# Patient Record
Sex: Female | Born: 1950 | Race: White | Hispanic: No | Marital: Married | Smoking: Former smoker
Health system: Southern US, Community
[De-identification: ages and names within clinical notes are randomized; demographics above are authoritative.]

## PROBLEM LIST (undated history)

## (undated) DIAGNOSIS — D219 Benign neoplasm of connective and other soft tissue, unspecified: Secondary | ICD-10-CM

## (undated) DIAGNOSIS — H3530 Unspecified macular degeneration: Secondary | ICD-10-CM

## (undated) DIAGNOSIS — L409 Psoriasis, unspecified: Secondary | ICD-10-CM

## (undated) DIAGNOSIS — N893 Dysplasia of vagina, unspecified: Secondary | ICD-10-CM

## (undated) DIAGNOSIS — D072 Carcinoma in situ of vagina: Secondary | ICD-10-CM

## (undated) DIAGNOSIS — N870 Mild cervical dysplasia: Secondary | ICD-10-CM

## (undated) HISTORY — DX: Unspecified macular degeneration: H35.30

## (undated) HISTORY — PX: OTHER SURGICAL HISTORY: SHX169

## (undated) HISTORY — DX: Psoriasis, unspecified: L40.9

## (undated) HISTORY — DX: Dysplasia of vagina, unspecified: N89.3

## (undated) HISTORY — DX: Benign neoplasm of connective and other soft tissue, unspecified: D21.9

## (undated) HISTORY — DX: Carcinoma in situ of vagina: D07.2

## (undated) HISTORY — DX: Mild cervical dysplasia: N87.0

---

## 1983-11-19 HISTORY — PX: TUBAL LIGATION: SHX77

## 1997-11-18 HISTORY — PX: ABDOMINAL HYSTERECTOMY: SHX81

## 1998-04-11 ENCOUNTER — Other Ambulatory Visit: Admission: RE | Admit: 1998-04-11 | Discharge: 1998-04-11 | Payer: Self-pay | Admitting: Obstetrics and Gynecology

## 1998-06-06 ENCOUNTER — Other Ambulatory Visit: Admission: RE | Admit: 1998-06-06 | Discharge: 1998-06-06 | Payer: Self-pay | Admitting: Obstetrics and Gynecology

## 1998-07-27 ENCOUNTER — Other Ambulatory Visit: Admission: RE | Admit: 1998-07-27 | Discharge: 1998-07-27 | Payer: Self-pay | Admitting: Obstetrics and Gynecology

## 1998-08-28 ENCOUNTER — Inpatient Hospital Stay (HOSPITAL_COMMUNITY): Admission: RE | Admit: 1998-08-28 | Discharge: 1998-08-30 | Payer: Self-pay | Admitting: Obstetrics and Gynecology

## 1999-02-05 ENCOUNTER — Ambulatory Visit (HOSPITAL_COMMUNITY): Admission: RE | Admit: 1999-02-05 | Discharge: 1999-02-05 | Payer: Self-pay | Admitting: Gastroenterology

## 1999-03-21 ENCOUNTER — Other Ambulatory Visit: Admission: RE | Admit: 1999-03-21 | Discharge: 1999-03-21 | Payer: Self-pay | Admitting: Radiology

## 1999-04-06 ENCOUNTER — Other Ambulatory Visit: Admission: RE | Admit: 1999-04-06 | Discharge: 1999-04-06 | Payer: Self-pay | Admitting: Obstetrics and Gynecology

## 1999-05-09 ENCOUNTER — Other Ambulatory Visit: Admission: RE | Admit: 1999-05-09 | Discharge: 1999-05-09 | Payer: Self-pay | Admitting: Obstetrics and Gynecology

## 1999-06-28 ENCOUNTER — Encounter (INDEPENDENT_AMBULATORY_CARE_PROVIDER_SITE_OTHER): Payer: Self-pay | Admitting: Specialist

## 1999-06-28 ENCOUNTER — Other Ambulatory Visit: Admission: RE | Admit: 1999-06-28 | Discharge: 1999-06-28 | Payer: Self-pay | Admitting: Obstetrics and Gynecology

## 1999-10-30 ENCOUNTER — Other Ambulatory Visit: Admission: RE | Admit: 1999-10-30 | Discharge: 1999-10-30 | Payer: Self-pay | Admitting: Obstetrics and Gynecology

## 2000-04-07 ENCOUNTER — Other Ambulatory Visit: Admission: RE | Admit: 2000-04-07 | Discharge: 2000-04-07 | Payer: Self-pay | Admitting: Obstetrics and Gynecology

## 2000-09-01 ENCOUNTER — Other Ambulatory Visit: Admission: RE | Admit: 2000-09-01 | Discharge: 2000-09-01 | Payer: Self-pay | Admitting: Obstetrics and Gynecology

## 2001-10-01 ENCOUNTER — Other Ambulatory Visit: Admission: RE | Admit: 2001-10-01 | Discharge: 2001-10-01 | Payer: Self-pay | Admitting: Obstetrics and Gynecology

## 2002-04-02 ENCOUNTER — Other Ambulatory Visit: Admission: RE | Admit: 2002-04-02 | Discharge: 2002-04-02 | Payer: Self-pay | Admitting: Obstetrics and Gynecology

## 2003-04-28 ENCOUNTER — Other Ambulatory Visit: Admission: RE | Admit: 2003-04-28 | Discharge: 2003-04-28 | Payer: Self-pay | Admitting: Obstetrics and Gynecology

## 2003-09-14 ENCOUNTER — Encounter: Admission: RE | Admit: 2003-09-14 | Discharge: 2003-09-14 | Payer: Self-pay | Admitting: Family Medicine

## 2004-05-08 ENCOUNTER — Other Ambulatory Visit: Admission: RE | Admit: 2004-05-08 | Discharge: 2004-05-08 | Payer: Self-pay | Admitting: Obstetrics and Gynecology

## 2005-05-10 ENCOUNTER — Other Ambulatory Visit: Admission: RE | Admit: 2005-05-10 | Discharge: 2005-05-10 | Payer: Self-pay | Admitting: Addiction Medicine

## 2005-09-23 ENCOUNTER — Other Ambulatory Visit: Admission: RE | Admit: 2005-09-23 | Discharge: 2005-09-23 | Payer: Self-pay | Admitting: Obstetrics and Gynecology

## 2005-11-18 HISTORY — PX: OTHER SURGICAL HISTORY: SHX169

## 2005-12-06 ENCOUNTER — Ambulatory Visit (HOSPITAL_BASED_OUTPATIENT_CLINIC_OR_DEPARTMENT_OTHER): Admission: RE | Admit: 2005-12-06 | Discharge: 2005-12-06 | Payer: Self-pay | Admitting: Obstetrics and Gynecology

## 2005-12-06 ENCOUNTER — Encounter (INDEPENDENT_AMBULATORY_CARE_PROVIDER_SITE_OTHER): Payer: Self-pay | Admitting: Specialist

## 2006-05-12 ENCOUNTER — Other Ambulatory Visit: Admission: RE | Admit: 2006-05-12 | Discharge: 2006-05-12 | Payer: Self-pay | Admitting: Obstetrics and Gynecology

## 2006-11-15 ENCOUNTER — Ambulatory Visit (HOSPITAL_COMMUNITY): Admission: RE | Admit: 2006-11-15 | Discharge: 2006-11-16 | Payer: Self-pay | Admitting: Orthopedic Surgery

## 2007-01-14 ENCOUNTER — Other Ambulatory Visit: Admission: RE | Admit: 2007-01-14 | Discharge: 2007-01-14 | Payer: Self-pay | Admitting: Obstetrics and Gynecology

## 2007-04-03 ENCOUNTER — Other Ambulatory Visit: Admission: RE | Admit: 2007-04-03 | Discharge: 2007-04-03 | Payer: Self-pay | Admitting: Obstetrics and Gynecology

## 2007-04-14 ENCOUNTER — Ambulatory Visit: Admission: RE | Admit: 2007-04-14 | Discharge: 2007-04-14 | Payer: Self-pay | Admitting: Gynecologic Oncology

## 2007-08-04 ENCOUNTER — Other Ambulatory Visit: Admission: RE | Admit: 2007-08-04 | Discharge: 2007-08-04 | Payer: Self-pay | Admitting: Gynecologic Oncology

## 2007-08-04 ENCOUNTER — Encounter: Payer: Self-pay | Admitting: Gynecologic Oncology

## 2007-08-04 ENCOUNTER — Ambulatory Visit: Admission: RE | Admit: 2007-08-04 | Discharge: 2007-08-04 | Payer: Self-pay | Admitting: Gynecologic Oncology

## 2007-12-30 ENCOUNTER — Encounter: Payer: Self-pay | Admitting: Gynecologic Oncology

## 2007-12-30 ENCOUNTER — Ambulatory Visit: Admission: RE | Admit: 2007-12-30 | Discharge: 2007-12-30 | Payer: Self-pay | Admitting: Gynecologic Oncology

## 2007-12-30 ENCOUNTER — Other Ambulatory Visit: Admission: RE | Admit: 2007-12-30 | Discharge: 2007-12-30 | Payer: Self-pay | Admitting: Gynecologic Oncology

## 2008-05-04 ENCOUNTER — Other Ambulatory Visit: Admission: RE | Admit: 2008-05-04 | Discharge: 2008-05-04 | Payer: Self-pay | Admitting: Gynecologic Oncology

## 2008-05-04 ENCOUNTER — Ambulatory Visit: Admission: RE | Admit: 2008-05-04 | Discharge: 2008-05-04 | Payer: Self-pay | Admitting: Gynecologic Oncology

## 2008-05-04 ENCOUNTER — Ambulatory Visit (HOSPITAL_COMMUNITY): Admission: RE | Admit: 2008-05-04 | Discharge: 2008-05-04 | Payer: Self-pay | Admitting: Gynecologic Oncology

## 2008-05-04 ENCOUNTER — Encounter: Payer: Self-pay | Admitting: Gynecologic Oncology

## 2008-11-02 ENCOUNTER — Other Ambulatory Visit: Admission: RE | Admit: 2008-11-02 | Discharge: 2008-11-02 | Payer: Self-pay | Admitting: Gynecologic Oncology

## 2008-11-02 ENCOUNTER — Ambulatory Visit: Admission: RE | Admit: 2008-11-02 | Discharge: 2008-11-02 | Payer: Self-pay | Admitting: Gynecologic Oncology

## 2008-11-02 ENCOUNTER — Encounter: Payer: Self-pay | Admitting: Gynecologic Oncology

## 2009-04-25 ENCOUNTER — Encounter: Payer: Self-pay | Admitting: Gynecologic Oncology

## 2009-04-25 ENCOUNTER — Ambulatory Visit: Admission: RE | Admit: 2009-04-25 | Discharge: 2009-04-25 | Payer: Self-pay | Admitting: Gynecologic Oncology

## 2009-04-25 ENCOUNTER — Other Ambulatory Visit: Admission: RE | Admit: 2009-04-25 | Discharge: 2009-04-25 | Payer: Self-pay | Admitting: Gynecologic Oncology

## 2010-08-22 ENCOUNTER — Ambulatory Visit: Admission: RE | Admit: 2010-08-22 | Discharge: 2010-08-22 | Payer: Self-pay | Admitting: Gynecologic Oncology

## 2010-08-22 ENCOUNTER — Other Ambulatory Visit: Admission: RE | Admit: 2010-08-22 | Discharge: 2010-08-22 | Payer: Self-pay | Admitting: Gynecologic Oncology

## 2011-04-02 NOTE — Consult Note (Signed)
NAMEJUDIE, HOLLICK               ACCOUNT NO.:  [id number]   MEDICAL RECORD NO.:  [id number]          PATIENT TYPE:  OUT   LOCATION:  XRAY                         FACILITY:  [facility]   PHYSICIAN:  Paola A. Duard Brady, MD    DATE OF BIRTH:  Apr 12, 1951   DATE OF CONSULTATION:  DATE OF DISCHARGE:                                 CONSULTATION   Ms. 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  I last saw her in February 2009 at which time her  exam and Pap smear were unremarkable.  She had been using Efudex  starting in September 2008.  Followup Pap smear in February was normal  and she comes in.  She is overall doing quite well.  She does complain  of some intermittent right lower quadrant pain that has been going on  for a few months.  It is intermittent.  She does not think that it is  getting any worse.  It does not wake her up at night.  Does not seem to  be related to her bowel or bladder habits.  Though she occasionally says  it feels that it might be like a gas pain.  She really thinks it is  related to her ovaries more so than anything else.  She denies any  significant change in her bowel or bladder habits.  She does need  refills on her Premarin cream.   PHYSICAL EXAMINATION:  VITAL SIGNS:  Weight 159 pounds, blood pressure  128/78.  GENERAL:  Well-nourished, well-developed female in no acute distress.  ABDOMEN:  Soft, nontender, nondistended.  There is no palpable masses or  hepatosplenomegaly.  Groins are negative for adenopathy.  PELVIC:  External genitalia is within normal limits.  The vagina is  markedly atrophic.  The vaginal cuff is visualized.  There are no  visible lesions.  ThinPrep Pap was submitted without difficulty.  Bimanual examination does reveal some tenderness in the right lower  quadrant on bimanual examination.  There is a questionable fullness so I  cannot feel a distinct mass.  The left side is completely  asymptomatic  with no masses or nodularity.  RECTAL:  Confirms some tenderness on the right side.  Though a discrete  mass cannot be palpated, there is a fullness.   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  Her last Pap smear was normal after treatment with Efudex  and Premarin cream.  There is a fullness and some tenderness in the  right lower quadrant.  She had a transvaginal ultrasound done today that  shows normal-appearing adnexa.  This may, in fact, be  related to her bowels, and she will try to improve her bowel regimen and  regularity.  If today's Pap smear is normal, she can return to see Dr.  Eda Paschal in 6 months for followup.  She is given prescriptions of her  Premarin cream which she will continue using.  If her Pap smear is  abnormal, will need to be dispositioned pending the results.  Paola A. Duard Brady, MD  Electronically Signed     PAG/MEDQ  D:  05/04/2008  T:  05/04/2008  Job:  [id number]   cc:   Reuel Boom L. Eda Paschal, M.D.  Fax: [phone]   Quita Skye. Artis Flock, M.D.  Fax: [phone]   Telford Nab, R.N.  [address]  [place], [place] [address]

## 2011-04-02 NOTE — Consult Note (Signed)
Lori Gordon, Lori Gordon               ACCOUNT NO.:  [id number]   MEDICAL RECORD NO.:  [id number]          PATIENT TYPE:  OUT   LOCATION:  GYN                          FACILITY:  [facility]   PHYSICIAN:  Paola A. Duard Brady, MD    DATE OF BIRTH:  [birth year]   DATE OF CONSULTATION:  04/14/2007  DATE OF DISCHARGE:                                 CONSULTATION   REFERRING PHYSICIAN:  Dr. Edyth Gunnels.   PRIMARY CARE PHYSICIAN:  Dr. Hulan Saas.   The patient is seen today in consultation at the request of Dr.  Eda Paschal.  The patient is of 60 year old gravida 2, para 2 whose has  been seeing Dr. Eda Paschal for annual examinations for quite some time.  She underwent a hysterectomy in 1999 for fibroids.  This was an  abdominal hysterectomy.  She states that she also thinks she has had  some abnormal Pap smears prior to that.  From 1999 to 2007 she has had  some marginal Pap smears and has had more frequent repeat Pap smear  testing.  In January 2007 she had a Pap smear that showed high-grade  dysplasia.  She underwent laser ablation for a biopsy-proven low grade  dysplasia.  Follow-up Pap smear in June 2007 again low grade dysplasia.  February 2008 high grade dysplasia, vaginal biopsy in March 2008 was  negative and she was started on Premarin vaginal cream.  Repeat Pap  smear in May 2008 revealed high-grade dysplasia.  She comes in for  referral.  She is otherwise doing fairly well.  She started using her  Premarin vaginal cream in part due to vaginal dryness, but since she  fell and broke her leg over the holidays in 2007 she has not been  sexually active and she cannot really comment whether the dryness has  gotten any better or stayed the same.  She has not had any irritation,  she is using 0.5 gram of Premarin 3 times a week.   PAST MEDICAL HISTORY:  None.   PAST SURGICAL HISTORY:  Left knee, left leg surgery 2007 and 2008,  abdominal hysterectomy in 1999, tubal ligation in 1986, chin  cyst  surgery in 1970.   MEDICATIONS:  Premarin cream, multivitamin, calcium, baby aspirin.   ALLERGIES:  CODEINE which causes abdominal pain.   SOCIAL HISTORY:  She quit smoking in the 1980s.  She drinks alcohol  socially.  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  She is  married.   FAMILY HISTORY:  Her mother had breast cancer in her 82s.  She also has  hypertension and diabetes.  She says her father has coronary disease.   HEALTH MANAGEMENT:  She is up-to-date on her mammograms and her  colonoscopies.   PHYSICAL EXAMINATION:  Height 5 foot, 1-1/2 inches, weight 158 pounds,  blood pressure 122/78, pulse 80, respirations 20.  Well-nourished, well-  developed female in no acute distress.  Groins are negative for  adenopathy.  External genitalia is within normal limits.  The vagina is  atrophic.  The vaginal cuff is visualized, there are no gross visible  lesions.  Colposcopic evaluation performed after the application acetic  acid.  On the lower half of the vaginal cuff there are several small  areas of acetowhite epithelial changes.  There is no associated  mosaicism or abnormal vascularity.  There is no evidence of any  carcinoma.  Bimanual examination reveals a smooth vaginal cuff with no  nodularity.   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   PLAN:  She has only been using the Premarin vaginal cream for 2 months.  I would recommend continued use of the Premarin vaginal cream.  There is  no evidence of any invasive carcinoma today.  The need for close follow-  up was discussed and stressed with the patient.  She is very compliant  and she will return to see [place] in September 2008 for repeat Pap smear and  colposcopy.  She states that she has her annual examination with Dr.  Eda Paschal  scheduled in June.  We ask that he defer her Pap smear until  we see her in September.  Questions regarding this were elicited and  answered to her satisfaction.  She was very pleased with today's visit.      Paola A. Duard Brady, MD  Electronically Signed     PAG/MEDQ  D:  04/14/2007  T:  04/14/2007  Job:  [id number]   cc:   Dr. Fabienne Bruns. Eda Paschal, M.D.  Fax: [phone]   Telford Nab, R.N.  [address]  [place], [place] [address]

## 2011-04-02 NOTE — Consult Note (Signed)
NAMEDESTENI, PISCOPO               ACCOUNT NO.:  [id number]   MEDICAL RECORD NO.:  [id number]          PATIENT TYPE:  OUT   LOCATION:  GYN                          FACILITY:  [facility]   PHYSICIAN:  Paola A. Duard Brady, MD    DATE OF BIRTH:  [birth year]   DATE OF CONSULTATION:  08/04/2007  DATE OF DISCHARGE:                                 CONSULTATION   The patient is a 60 year old who I initially saw May 2008.  She has a  history of having abnormal Pap smears from 1999 to 2007.  She did  undergo a hysterectomy for fibroids in 1999.  In January of  2007, she  had a Pap smear showed high-grade changes.  She underwent laser ablation  for biopsy-proven low grade dysplasia.  Follow-up Pap smear June 2000  again showed low grade dysplasia, in February 2008 high-grade dysplasia  with a negative biopsy.  She was started on Premarin vaginal cream.  She  was seen by me in May 2008, Pap smear was not repeated at that time.  She did have some small acetowhite epithelial changes at the vaginal  cuff most consistent visibly with CIN 1.  It was recommended that she  continue her Premarin and come in today for follow-up.  She is overall  doing quite well and really denies any significant complaints.  She has  had no issue with the vaginal Premarin cream and has had no bleeding or  pain.   PHYSICAL EXAMINATION:  Weight 162 pounds, well nourished alert female in  no acute distress.  PELVIC:  External genitalia is within normal limits.  The vagina is atrophic, the vaginal cuff is visualized.  There is a  physiologic discharge and there are no gross visible lesions.  Thin prep  Pap smear done without difficulty.  Colposcopic evaluation was performed  after the application of acetic acid.  There is no acetowhite epithelial  changes noted.  Bimanual examination reveals no nodularity or lesions  palpable.   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   PLAN:  To follow results for Pap smear from today.   Will notify her the  results and determine her disposition pending them.      Paola A. Duard Brady, MD  Electronically Signed     PAG/MEDQ  D:  08/04/2007  T:  08/05/2007  Job:  [id number]   cc:   Reuel Boom L. Eda Paschal, M.D.  Fax: [phone]   Quita Skye. Artis Flock, M.D.  Fax: [phone]   Telford Nab, R.N.  [address]  [place], [place] [address]

## 2011-04-02 NOTE — Consult Note (Signed)
Lori Gordon, Lori Gordon               ACCOUNT NO.:  [id number]   MEDICAL RECORD NO.:  [id number]          PATIENT TYPE:  OUT   LOCATION:  GYN                          FACILITY:  [facility]   PHYSICIAN:  Paola A. 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  I saw  her in December of 2009, at which time her Pap smear again revealed VAIN  1.  We had done a colposcopy in December which showed no visible  lesions.  She has been using her Premarin cream twice a week and comes  in today for follow up.  She is overall doing quite well.  Denies any  vaginal bleeding, any vaginal discharge, itching or other  symptomatology.  She does complain of some dryness with intercourse.  While she and her husband have tried K-Y Jelly, she has not found that  to be very satisfactory.  She does complain of some burning after  intercourse.  She otherwise is without complaints.  She is due for her  mammogram in the next month or two.  She denies any change in bowel or  bladder habits.  She, her husband, her sister and her brother-in-law are  planning a very lovely trip for a [place] to [place] in [place].   PHYSICAL EXAMINATION:  VITAL SIGNS:  Weight 159 pounds.  GENERAL:  Well-nourished, well-developed female in no acute distress.  PELVIC:  External genitalia is within normal limits.  The vagina is  markedly atrophic.  The vaginal cuff is visualized.  There are no gross  visible lesions.  There is no discharge.  ThinPrep Pap was done without  difficulty.  Colposcopic evaluation performed after the application of  acetic acid.  There are no acetowhite epithelial changes or lesions  noted.  Bimanual examination was a smooth vaginal mucosa with no  lesions.   ASSESSMENT:  A  60 year old with VAIN.   PLAN:  1. Will follow up the results of her Pap smear from today.  She will      continue using her Premarin cream.  She was given the name of      Astroglide and plans to use p.r.n. for intercourse.  2. She was given an appointment to return to see me in December.  She      stated when she would be going back to see Dr. Eda Paschal.  I have      discussed with her that if we have persistently only low-grade      changes without any      worsening of her disease, and it has been fairly stable for 2      years, we could consider going back to Dr. Eda Paschal, or we would      be happy to continue following her here.  Will  determine her      disposition  pending the results from today's Pap smear.      Paola A. Duard Brady, MD  Electronically Signed     PAG/MEDQ  D:  04/25/2009  T:  04/25/2009  Job:  [id number]   cc:   Telford Nab, R.N.  [address]  [place], [place] [address]   Rande Brunt. Eda Paschal, M.D.  Fax: [phone]   Quita Skye. Artis Flock, M.D.  Fax: [phone]

## 2011-04-02 NOTE — Consult Note (Signed)
NAMELIESEL, PECKENPAUGH               ACCOUNT NO.:  [id number]   MEDICAL RECORD NO.:  [id number]          PATIENT TYPE:  OUT   LOCATION:  GYN                          FACILITY:  [facility]   PHYSICIAN:  Paola A. Duard Brady, MD    DATE OF BIRTH:  [birth year]   DATE OF CONSULTATION:  11/02/2008  DATE OF DISCHARGE:                                 CONSULTATION   Ms. Braaksma is a very pleasant 60 year old who has undergone a  hysterectomy and is referred to [place] by Dr. Eda Paschal for VAIN.  I last  saw her in June of 2009.  Her exam at that time was fairly unremarkable.  Colposcopy was not performed, Pap smear was performed that showed low  grade dysplasia.  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  She  continued using her Premarin vaginal cream and is overall doing quite  well.  When I last saw her in June she had been complaining of some  right lower quadrant pain, there was a questionable fullness.  She did  undergo an ultrasound that was negative.  She states her pain is  subsequently resolved.  She continues using her Premarin cream, does not  require refills.  She denies any vaginal bleeding or any other  symptomatology.   PHYSICAL EXAMINATION:  Weight 160 pounds, well-nourished, well-developed  female in no acute distress.  PELVIC:  External genitalia is within normal limits.  The vagina has  lost the rugations but is quite redundant.  The vaginal cuff is  visualized.  ThinPrep Pap was smeared without difficulty.  Colposcopic  evaluation was performed after the application of acetic acid.  There  are no visible lesions, there is no acetowhite epithelial changes.  Entire vagina was examined carefully.  Bimanual examination reveals no  masses or nodularity.   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   PLAN:  She will continue using her Premarin cream.  Will follow up with  results from Pap smear from today and determine  her disposition pending  the results of her Pap smear.      Paola A. Duard Brady, MD  Electronically Signed     PAG/MEDQ  D:  11/02/2008  T:  11/03/2008  Job:  [id number]   cc:   Reuel Boom L. Eda Paschal, M.D.  Fax: [phone]   Quita Skye. Artis Flock, M.D.  Fax: [phone]   Telford Nab, R.N.  [address]  [place], [place] [address]

## 2011-04-02 NOTE — Consult Note (Signed)
Lori Gordon, Lori Gordon               ACCOUNT NO.:  [id number]   MEDICAL RECORD NO.:  [id number]          PATIENT TYPE:  OUT   LOCATION:  GYN                          FACILITY:  [facility]   PHYSICIAN:  Paola A. Duard Brady, MD    DATE OF BIRTH:  Jun 27, 1951   DATE OF CONSULTATION:  DATE OF DISCHARGE:                                 CONSULTATION   Ms. Santini is a 60 year old with a history of undergoing a hysterectomy  in 1999. From 1999 to 2007 she intermittent abnormal Pap smears.  In  January 2007, she had a Pap smear that showed high-grade changes and she  underwent laser ablation for biopsy-proven low grade dysplasia. Follow-  up Pap smears again continued to show low grade dysplasia.  In February  2008, she had a high-grade Pap smear with negative biopsy and was  started on Premarin cream. I initially saw her in May 2008, Pap smear  was not repeated at that time. She did have some small acetowhite  epithelial changes at the vaginal cuff most likely consistent with CIN  1. Pap smear at that time revealed atypical squamous cells and cannot  exclude high grade dysplasia. She was treated with Efudex and Premarin  and comes in today for her first Pap smear. She has been continuing to  use her Premarin 1/2 gram intravaginally 3 times a week. She denies any  vaginal bleeding.   PHYSICAL EXAMINATION:  Weight 161 pounds. Well-nourished, well-developed  female in no acute distress.  PELVIC:  External genitalia is within normal limits. The vagina is  somewhat atrophic. She does have evidence of Premarin cream in the  vagina that was wiped away. Pap smear was performed without difficulty.  Bimanual examination reveals no masses or nodularity.   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   PLAN:  Will followup on results of her Pap smear from today. If today's  Pap smear is normal then she will return to see [place] in 4 months for  repeat Pap smear. If today's Pap smear is abnormal will need to  have her  come back to see [place] in May with colposcopy at that time.      Paola A. Duard Brady, MD  Electronically Signed     PAG/MEDQ  D:  12/30/2007  T:  12/31/2007  Job:  [id number]   cc:   Reuel Boom L. Eda Paschal, M.D.  Fax: [phone]   Quita Skye. Artis Flock, M.D.  Fax: [phone]   Telford Nab, R.N.  [address]  [place], [place] [address]

## 2011-04-05 NOTE — Op Note (Signed)
NAMEJOHNA, KEARL               ACCOUNT NO.:  [id number]   MEDICAL RECORD NO.:  [id number]          PATIENT TYPE:  AMB   LOCATION:  [facility]                         FACILITY:  [facility]   PHYSICIAN:  Daniel L. Gottsegen, M.D.DATE OF BIRTH:  November 02, 1951   DATE OF PROCEDURE:  12/06/2005  DATE OF DISCHARGE:                                 OPERATIVE REPORT   PREOPERATIVE DIAGNOSIS:  Vaginal intraepithelial neoplasia III.   POSTOPERATIVE DIAGNOSIS:  Vaginal intraepithelial neoplasia III.   OPERATIONS:  Biopsy of the vagina followed by CO2 laser of the vagina.   SURGEON:  Dr. Eda Paschal.   ANESTHESIA:  General.   INDICATIONS:  The patient is a 60 year old female who had presented to the  office for routine Pap smears. Initially she had a Pap smear showing VAIN I.  This was after a history of CIN prior to her hysterectomy. Biopsies at that  time failed to reveal dysplasia and because the Pap was low grade, it was  elected to watch her. However, subsequent Pap smear showed high-grade VAIN  III. As a result of this, she is to go to the operating room for colposcopy,  biopsy and CO2 laser of the vagina.   FINDINGS:  At the top of the vaginal cuff, there was a large area of white  epithelium that extended from 12 to 6 for approximately 4-5 cm. There was no  atypical vessels but it certainly was most consistent with VAIN.   DESCRIPTION OF PROCEDURE:  After adequate general anesthesia, the patient  was placed in the dorsal lithotomy position, prepped and draped in a sterile  manner. The patient was colposcoped with 4% acetic acid and there were areas  of white epithelium described above at the top of the cuff that appeared to  be consistent with be VAIN. Three separate areas were biopsied and then a  CO2 laser was utilized at 7 watts continuous power. The whole area was  outlined and then it was lasered to a depth of about 4 mm to destroy the  dysplasia. There was no bleeding noted. The procedure  proceeded well. At the  termination of the procedure, there was still no bleeding. The patient left  the operating room in satisfactory condition.      Daniel L. Eda Paschal, M.D.  Electronically Signed     DLG/MEDQ  D:  12/06/2005  T:  12/06/2005  Job:  [id number]

## 2011-04-05 NOTE — Op Note (Signed)
Lori Gordon, Lori Gordon               ACCOUNT NO.:  [id number]   MEDICAL RECORD NO.:  [id number]          PATIENT TYPE:  AMB   LOCATION:  [facility]                          FACILITY:  [facility]   PHYSICIAN:  John L. Rendall, M.D.  DATE OF BIRTH:  04-Dec-1950   DATE OF PROCEDURE:  11/15/2006  DATE OF DISCHARGE:                               OPERATIVE REPORT   PREOPERATIVE DIAGNOSIS:  Split depression fracture lateral tibial  plateau, left knee.   SURGICAL PROCEDURES:  Arthroscopic partial medial meniscectomy and  arthroscopically assisted open reduction internal fixation lateral  plateau fracture with percutaneous screws and washers.   POSTOPERATIVE DIAGNOSIS:  Arthroscopic partial medial meniscectomy and  arthroscopically assisted open reduction internal fixation lateral  plateau fracture with percutaneous screws and washers.   SURGEON:  John L. Rendall, M.D.   ASSISTANT:  [facility]   ANESTHESIA:  General.   PATHOLOGY:  The patient has an anterior horn tear of the medial meniscus  which appears to have blocked extension.  It involves about 50% of the  anterior horn attachment under the fat pad medially and she has a  posterior lateral corner fracture with displacement and separation.  The  lateral meniscus is intact.  The cruciate ligaments are intact.   PROCEDURE:  Under general anesthesia the leg was wrapped with the  Esmarch and the tourniquet was elevated to 300 mm.  Prior to prep and  drape the knee is manipulated see if the fracture would move and it did  not.  It is then prepped and draped and the arthroscope was used to look  through the knee.  At diagnostic arthroscopy the anterior horn tear of  medial meniscus is found and resected back to stable meniscal rim with a  combination of basket forceps and intra-articular shaver.  The  patellofemoral articulation is without deterioration.  The lateral  compartment shows some chondromalacia along the margin of the fracture  line  in the posterior lateral corner but the meniscus itself was intact  and only minor scuffing on the femur.  With this determined and the  depressed segment and the posterior lateral corner under the meniscus, a  one inch incision was made at just below the joint line.  Using a Cobb  elevator and punch, the depressed segment of bone was disimpacted and  pushed upward and tapped in place with a bone punch once this was  successfully elevated and this is checked with the scope, the guide wire  was placed from the posterior lateral corner to the anteromedial tibia  about a centimeter below the joint line, a second guide wire and screw  were placed anterior to this.  The very first attempt went posteriorly  out the back slightly and was removed and replaced.  With these two  screw supporting the defect nicely, look intra-articularly showed good  fixation and elevation of the depressed fragment and at this point there  was not felt to be enough bone deficit to warrant grafting.  The  tourniquet is left up to the end of the case.  The wound at the  anterolateral tibia was closed with 0-0 and 2-0 Vicryl, skin with clips.  Sterile compression bandage is applied.  The tourniquet went down at an  hour and 18  minutes.  The patient returned to recovery in good condition with her  leg in a knee immobilizer.  We will be starting CPM for her to encourage  gentle motion, but she will remain nonweightbearing and to approximately  four weeks pass.      John L. Rendall, M.D.  Electronically Signed     JLR/MEDQ  D:  11/15/2006  T:  11/15/2006  Job:  [id number]

## 2011-04-29 ENCOUNTER — Other Ambulatory Visit (HOSPITAL_COMMUNITY)
Admission: RE | Admit: 2011-04-29 | Discharge: 2011-04-29 | Disposition: A | Discharge status: Home / Self Care | Payer: Managed Care, Other (non HMO) | Source: Ambulatory Visit | Attending: Obstetrics and Gynecology | Admitting: Obstetrics and Gynecology

## 2011-04-29 ENCOUNTER — Other Ambulatory Visit: Payer: Self-pay | Admitting: Obstetrics and Gynecology

## 2011-04-29 ENCOUNTER — Ambulatory Visit (INDEPENDENT_AMBULATORY_CARE_PROVIDER_SITE_OTHER): Payer: Managed Care, Other (non HMO) | Admitting: Obstetrics and Gynecology

## 2011-04-29 DIAGNOSIS — R82998 Other abnormal findings in urine: Secondary | ICD-10-CM

## 2011-04-29 DIAGNOSIS — Z01419 Encounter for gynecological examination (general) (routine) without abnormal findings: Secondary | ICD-10-CM

## 2011-04-29 DIAGNOSIS — Z124 Encounter for screening for malignant neoplasm of cervix: Secondary | ICD-10-CM | POA: Insufficient documentation

## 2011-06-24 ENCOUNTER — Encounter: Payer: Self-pay | Admitting: Obstetrics and Gynecology

## 2011-07-23 DIAGNOSIS — N893 Dysplasia of vagina, unspecified: Secondary | ICD-10-CM | POA: Insufficient documentation

## 2011-07-23 DIAGNOSIS — D219 Benign neoplasm of connective and other soft tissue, unspecified: Secondary | ICD-10-CM | POA: Insufficient documentation

## 2011-07-23 DIAGNOSIS — N870 Mild cervical dysplasia: Secondary | ICD-10-CM | POA: Insufficient documentation

## 2011-07-23 DIAGNOSIS — D072 Carcinoma in situ of vagina: Secondary | ICD-10-CM | POA: Insufficient documentation

## 2011-07-30 ENCOUNTER — Ambulatory Visit (INDEPENDENT_AMBULATORY_CARE_PROVIDER_SITE_OTHER): Payer: Managed Care, Other (non HMO) | Admitting: Obstetrics and Gynecology

## 2011-07-30 ENCOUNTER — Encounter: Payer: Self-pay | Admitting: Obstetrics and Gynecology

## 2011-07-30 ENCOUNTER — Other Ambulatory Visit (HOSPITAL_COMMUNITY)
Admission: RE | Admit: 2011-07-30 | Discharge: 2011-07-30 | Disposition: A | Discharge status: Home / Self Care | Payer: Managed Care, Other (non HMO) | Source: Ambulatory Visit | Attending: Obstetrics and Gynecology | Admitting: Obstetrics and Gynecology

## 2011-07-30 VITALS — BP 122/74

## 2011-07-30 DIAGNOSIS — B373 Candidiasis of vulva and vagina: Secondary | ICD-10-CM

## 2011-07-30 DIAGNOSIS — B3731 Acute candidiasis of vulva and vagina: Secondary | ICD-10-CM

## 2011-07-30 DIAGNOSIS — N898 Other specified noninflammatory disorders of vagina: Secondary | ICD-10-CM

## 2011-07-30 DIAGNOSIS — N893 Dysplasia of vagina, unspecified: Secondary | ICD-10-CM

## 2011-07-30 DIAGNOSIS — Z01419 Encounter for gynecological examination (general) (routine) without abnormal findings: Secondary | ICD-10-CM | POA: Insufficient documentation

## 2011-07-30 DIAGNOSIS — L293 Anogenital pruritus, unspecified: Secondary | ICD-10-CM

## 2011-07-30 NOTE — Progress Notes (Signed)
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 This is a problem that has been going on since 2007. 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 However back in 2007 patient had Paps that  Had become high-grade dyspasia. Patient therefore had a CO2 laser of the vagina. 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 She has been on vaginal estrogen cream. I referred her to Dr.Gehrung. Colposcopy with her was still consistent with low-grade dysplasia. Her changes have persisted on Pap smear. We have increased her estrogen cream to 3 times a week. This  Has so made intercourse more comfortable. She is complaining of vulvar itching today however.  External within normal limits. BUS within normal limits. Vaginal examination within normal limits. Wet prep obtained. Yeast present on Pap.  Assessment: 1. VAIN 2. Yeast vaginitis  Plan: 1. If this is low-grade dysplasia on Pap I will see her in 6 months recall. If this is high-grade dysplasia we will do another colposcopy. I told the patient today that if she has a recurrence of high-grade dysplasia I will ask the oncologist to do her CO2 laser. 2. Terconazole 3 cream. Patient used externally and internally.

## 2011-07-31 ENCOUNTER — Ambulatory Visit: Payer: Managed Care, Other (non HMO) | Admitting: Obstetrics and Gynecology

## 2012-04-29 ENCOUNTER — Encounter: Payer: Self-pay | Admitting: Obstetrics and Gynecology

## 2012-04-29 ENCOUNTER — Other Ambulatory Visit (HOSPITAL_COMMUNITY)
Admission: RE | Admit: 2012-04-29 | Discharge: 2012-04-29 | Disposition: A | Discharge status: Home / Self Care | Payer: Managed Care, Other (non HMO) | Source: Ambulatory Visit | Attending: Obstetrics and Gynecology | Admitting: Obstetrics and Gynecology

## 2012-04-29 ENCOUNTER — Ambulatory Visit (INDEPENDENT_AMBULATORY_CARE_PROVIDER_SITE_OTHER): Payer: Managed Care, Other (non HMO) | Admitting: Obstetrics and Gynecology

## 2012-04-29 VITALS — BP 124/76 | Ht 61.0 in | Wt 164.0 lb

## 2012-04-29 DIAGNOSIS — Z01419 Encounter for gynecological examination (general) (routine) without abnormal findings: Secondary | ICD-10-CM | POA: Insufficient documentation

## 2012-04-29 DIAGNOSIS — H3530 Unspecified macular degeneration: Secondary | ICD-10-CM | POA: Insufficient documentation

## 2012-04-29 DIAGNOSIS — L409 Psoriasis, unspecified: Secondary | ICD-10-CM | POA: Insufficient documentation

## 2012-04-29 NOTE — Progress Notes (Signed)
Patient came to see me today for her annual GYN exam. 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 Even after laser she has had some Pap smears which showed vaginal dysplasia. At one point I have her seen by the GYN oncologist who recommended conservative treatment and Premarin cream. She was returned to my care after her last visit with the oncologist in November, 2011. Her last Pap smear with me was in June of 2012 which showed ascus with an atrophic pattern. She is up-to-date on mammograms. We're trying to get the result of her last bone density. She is having no vaginal bleeding. She is having no pelvic pain.  HEENT: Within normal limits. Kennon Portela present. Neck: No masses. Supraclavicular lymph nodes: Not enlarged. Breasts: Examined in both sitting and lying position. Symmetrical without skin changes or masses. Abdomen: Soft no masses guarding or rebound. No hernias. Pelvic: External within normal limits. BUS within normal limits. Vaginal examination shows good estrogen effect, no cystocele enterocele or rectocele. Cervix and uterus absent. Adnexa within normal limits. Rectovaginal confirmatory. Extremities within normal limits.  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 We will get her bone density report. Continue Premarin vaginal cream. We will triaged based on Pap smear.

## 2012-04-30 LAB — URINALYSIS W MICROSCOPIC + REFLEX CULTURE
Bacteria, UA: NONE SEEN
Bilirubin Urine: NEGATIVE
Casts: NONE SEEN
Crystals: NONE SEEN
Glucose, UA: NEGATIVE mg/dL
Hgb urine dipstick: NEGATIVE
Ketones, ur: NEGATIVE mg/dL
Nitrite: NEGATIVE
Protein, ur: NEGATIVE mg/dL
Specific Gravity, Urine: 1.012 (ref 1.005–1.030)
Urobilinogen, UA: 0.2 mg/dL (ref 0.0–1.0)
pH: 6.5 (ref 5.0–8.0)

## 2012-05-01 LAB — URINE CULTURE: Colony Count: 8000

## 2012-07-23 ENCOUNTER — Encounter: Payer: Self-pay | Admitting: Obstetrics and Gynecology

## 2012-07-23 ENCOUNTER — Other Ambulatory Visit: Payer: Self-pay | Admitting: Obstetrics and Gynecology

## 2012-07-23 ENCOUNTER — Other Ambulatory Visit: Payer: Self-pay | Admitting: *Deleted

## 2012-07-23 DIAGNOSIS — N6009 Solitary cyst of unspecified breast: Secondary | ICD-10-CM

## 2012-10-29 ENCOUNTER — Other Ambulatory Visit (HOSPITAL_COMMUNITY)
Admission: RE | Admit: 2012-10-29 | Discharge: 2012-10-29 | Disposition: A | Discharge status: Home / Self Care | Payer: Managed Care, Other (non HMO) | Source: Ambulatory Visit | Attending: Obstetrics and Gynecology | Admitting: Obstetrics and Gynecology

## 2012-10-29 ENCOUNTER — Ambulatory Visit (INDEPENDENT_AMBULATORY_CARE_PROVIDER_SITE_OTHER): Payer: Managed Care, Other (non HMO) | Admitting: Obstetrics and Gynecology

## 2012-10-29 DIAGNOSIS — R87810 Cervical high risk human papillomavirus (HPV) DNA test positive: Secondary | ICD-10-CM | POA: Insufficient documentation

## 2012-10-29 DIAGNOSIS — Z1151 Encounter for screening for human papillomavirus (HPV): Secondary | ICD-10-CM | POA: Insufficient documentation

## 2012-10-29 DIAGNOSIS — N893 Dysplasia of vagina, unspecified: Secondary | ICD-10-CM

## 2012-10-29 DIAGNOSIS — N898 Other specified noninflammatory disorders of vagina: Secondary | ICD-10-CM

## 2012-10-29 DIAGNOSIS — Z01419 Encounter for gynecological examination (general) (routine) without abnormal findings: Secondary | ICD-10-CM | POA: Insufficient documentation

## 2012-10-29 NOTE — Progress Notes (Signed)
Patient came back to see me today for followup Pap smear. To  resummarized her history when she had her hysterectomy for fibroids in 1999 she did have CIN-1.  In 1993 she been treated for CIN. She has continued to have vaginal dysplasia on Pap smear. Colposcopy with biopsy in 2007 showed vaginal dysplasia of a low-grade type. However Pap smears continue to show high-grade dysplasia and in 2007 she underwent CO2 laser of the vagina. Biopsies  the day of the surgery showed vain 1. She has been treated with vaginal estrogen. She is continued to show vaginal dysplasia of a low-grade type on Pap smear. She was treated with both Efudex and Premarin vaginal cream without improvement of her Pap smears. Followup colposcopy with biopsy failed to reveal vaginal dysplasia. We referred her to Dr.Gehrig  due to colposcopy which failed to reveal vaginal dysplasia.  Dr. Duard Brady repeated colposcopy and also did not see vaginal dysplasia. She had recommended continued Pap smears with estrogen cream rather than additional surgical treatment. She has continued to have Pap smear showing vain 1. High risk HPV has not been repeated for a while.  Exam:Kim  Gardener present. External: Within normal limits. BUS: Within normal limits vaginal exam: Adequate estrogen effect.  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  Plan: Pap and  co-testing done. We'll triaged based on the results.

## 2012-10-29 NOTE — Addendum Note (Signed)
Addended by: Dayna Barker on: 10/29/2012 09:33 AM   Modules accepted: Orders

## 2012-10-29 NOTE — Patient Instructions (Signed)
We will call with results

## 2013-09-23 ENCOUNTER — Other Ambulatory Visit (HOSPITAL_COMMUNITY)
Admission: RE | Admit: 2013-09-23 | Discharge: 2013-09-23 | Disposition: A | Discharge status: Home / Self Care | Payer: Managed Care, Other (non HMO) | Source: Ambulatory Visit | Attending: Gynecologic Oncology | Admitting: Gynecologic Oncology

## 2013-09-23 ENCOUNTER — Encounter (INDEPENDENT_AMBULATORY_CARE_PROVIDER_SITE_OTHER): Payer: Self-pay

## 2013-09-23 ENCOUNTER — Ambulatory Visit: Payer: Managed Care, Other (non HMO) | Attending: Gynecologic Oncology | Admitting: Gynecologic Oncology

## 2013-09-23 ENCOUNTER — Encounter: Payer: Self-pay | Admitting: Gynecologic Oncology

## 2013-09-23 VITALS — BP 128/81 | HR 83 | Temp 97.6°F | Resp 16 | Ht 61.0 in | Wt 159.6 lb

## 2013-09-23 DIAGNOSIS — Z1151 Encounter for screening for human papillomavirus (HPV): Secondary | ICD-10-CM | POA: Insufficient documentation

## 2013-09-23 DIAGNOSIS — N893 Dysplasia of vagina, unspecified: Secondary | ICD-10-CM | POA: Insufficient documentation

## 2013-09-23 DIAGNOSIS — Z01419 Encounter for gynecological examination (general) (routine) without abnormal findings: Secondary | ICD-10-CM | POA: Insufficient documentation

## 2013-09-23 DIAGNOSIS — Z90710 Acquired absence of both cervix and uterus: Secondary | ICD-10-CM | POA: Insufficient documentation

## 2013-09-23 NOTE — Patient Instructions (Addendum)
We will notify you of your results and determine follow up pending your pap smear.    Colposcopy Care After Colposcopy is a procedure in which a special tool is used to magnify the surface of the cervix. A tissue sample (biopsy) may also be taken. This sample will be looked at for cervical cancer or other problems. After the test:  You may have some cramping.  Lie down for a few minutes if you feel lightheaded.   You may have some bleeding which should stop in a few days. HOME CARE  Do not have sex or use tampons for 2 to 3 days or as told.  Only take medicine as told by your doctor.  Continue to take your birth control pills as usual. Finding out the results of your test Ask when your test results will be ready. Make sure you get your test results. GET HELP RIGHT AWAY IF:  You are bleeding a lot or are passing blood clots.  You develop a fever of 102 F (38.9 C) or higher.  You have abnormal vaginal discharge.  You have cramps that do not go away with medicine.  You feel lightheaded, dizzy, or pass out (faint). MAKE SURE YOU:   Understand these instructions.  Will watch your condition.  Will get help right away if you are not doing well or get worse. Document Released: 04/22/2008 Document Revised: 01/27/2012 Document Reviewed: 06/03/2013 [facility] Patient Information 2014 [place], [place].

## 2013-09-23 NOTE — Progress Notes (Signed)
Consult Note: Gyn-Onc  Lori Gordon 62 y.o. female  CC:  Chief Complaint  Patient presents with  . CIN/VAIN    follow up     HPI: Patient is a 89 -year-old gravida 2 para 2 who we initially saw in May 2008 consult for vaginal dysplasia. We have not seen her since October 2011 therefore she comes in as a new patient. She underwent hysterectomy 1999 for uterine fibroids. 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 She history of prior dysplasia treated in 1993. 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 Should a colposcopy with biopsies in 2007 that showed low-grade disease however Pap smears continue to see a high-grade dysplasia in 2007 she underwent CO2 laser of the vagina. Biopsies on the day of surgery should be a INH was treated with vaginal estrogen. She's previously also been treated with Efudex as well as Premarin cream without improvement of her Pap smears. As stated above I last saw her in October 2011 at which time her Pap smear showed low-grade dysplasia and she was instructed to followup with Dr.taken. She last saw Dr.taken in December 2013. Pap smear at that time of a low-grade dysplasia with cells of higher grade dysplasia cannot be ruled out. 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 She comes in to reestablish care.  She's not been using her topical estrogen cream. She stay she's stop at about 6-8 months ago. She stated seemed to be really helped with the Pap smears and she was having some burning with it. She denies any bleeding any change about bladder habits. She last had her annual examination with Dr. Lupe Carney in October 2014. She is up-to-date on her mammograms with her last one being in September 2014. She had a colonoscopy a few years ago would be due in a few years and she is on the  "5 year plan".  There are no new medical issues and her family since we last saw her.   Current Meds:  Outpatient Encounter Prescriptions as of 09/23/2013   Medication Sig  . Calcium Carbonate-Vitamin D (CALCIUM + D PO) Take by mouth.    . conjugated estrogens (PREMARIN) vaginal cream Place vaginally daily.  . Glucosamine-Chondroitin (GLUCOSAMINE CHONDR COMPLEX PO) Take by mouth.    . Multiple Vitamin (MULTIVITAMIN) capsule Take 1 capsule by mouth daily.    . NON FORMULARY "OCCULAR VITAMIN"     Allergy:  Allergies  Allergen Reactions  . Codeine     "STOMACH CRAMPS"    Social Hx:   History   Social History  . Marital Status: Married    Spouse Name: N/A    Number of Children: N/A  . Years of Education: N/A   Occupational History  . Not on file.   Social History Main Topics  . Smoking status: Former Games developer  . Smokeless tobacco: Never Used  . Alcohol Use: 1.0 oz/week    2 drink(s) per week  . Drug Use: No  . Sexual Activity: Yes    Birth Control/ Protection: Surgical   Other Topics Concern  . Not on file   Social History Narrative  . No narrative on file    Past Surgical Hx:  Past Surgical History  Procedure Laterality Date  . Abdominal hysterectomy  1999    TAH  . Tubal ligation  1985  . Excision of cyst from chin    . C02 laser of vagina and vaginal biopsy  2007  . Broken leg  2007    Past Medical Hx:  Past Medical History  Diagnosis Date  . CIN I (cervical intraepithelial neoplasia I)   . Fibroid   . VAIN (vaginal intraepithelial neoplasia)   . VAIN III (vaginal intraepithelial neoplasia grade III)   . Psoriasis   . Macular degeneration, age related     Oncology Hx:   No history exists.    Family Hx:  Family History  Problem Relation Age of Onset  . Diabetes Mother   . Hypertension Mother   . Breast cancer Mother     Age 40  . Hypertension Father   . Heart disease Father   . Heart disease Paternal Grandmother   . Cancer Cousin     PATERNAL COUSINS W COLON CANCER  . Breast cancer Cousin     Mat.cousin Age 11  . Breast cancer Maternal Aunt     Age 73    Vitals:  Blood pressure 128/81,  pulse 83, temperature 97.6 F (36.4 C), resp. rate 16, height 5\' 1"  (1.549 m), weight 159 lb 9.6 oz (72.394 kg).  Physical Exam: Well-nourished well-developed female in no acute distress.  Pelvic: Normal external female genitalia. Vagina is markedly atrophic. They're no gross visible lesions. ThinPrep Pap smear was submitted without difficulty. Colposcopic evaluation was performed after the application acetic acid. There is some evidence of trauma from the Pap smear but no acetowhite epithelial changes. No visible lesions. No mosaicism. No palpitation. Bimanual examination reveals no masses or nodularity.  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 We will followup in results for Pap smear from today as well as HPV testing. We'll determine her disposition pending these results.  Suprena Travaglini A., MD 09/23/2013, 8:35 AM

## 2013-09-27 ENCOUNTER — Telehealth: Payer: Self-pay | Admitting: Gynecologic Oncology

## 2013-09-27 NOTE — Telephone Encounter (Signed)
Message left for patient with pap smear results: negative.  Instructed to call for any questions or concerns.  

## 2014-08-29 ENCOUNTER — Ambulatory Visit
Admission: RE | Admit: 2014-08-29 | Discharge: 2014-08-29 | Disposition: A | Discharge status: Home / Self Care | Payer: Managed Care, Other (non HMO) | Source: Ambulatory Visit | Attending: Family Medicine | Admitting: Family Medicine

## 2014-08-29 ENCOUNTER — Other Ambulatory Visit: Payer: Self-pay | Admitting: Family Medicine

## 2014-08-29 DIAGNOSIS — R059 Cough, unspecified: Secondary | ICD-10-CM

## 2014-08-29 DIAGNOSIS — R05 Cough: Secondary | ICD-10-CM

## 2014-09-08 ENCOUNTER — Other Ambulatory Visit (HOSPITAL_COMMUNITY)
Admission: RE | Admit: 2014-09-08 | Discharge: 2014-09-08 | Disposition: A | Discharge status: Home / Self Care | Payer: Managed Care, Other (non HMO) | Source: Ambulatory Visit | Attending: Gynecologic Oncology | Admitting: Gynecologic Oncology

## 2014-09-08 ENCOUNTER — Encounter: Payer: Self-pay | Admitting: Gynecologic Oncology

## 2014-09-08 ENCOUNTER — Ambulatory Visit: Payer: Managed Care, Other (non HMO) | Attending: Gynecologic Oncology | Admitting: Gynecologic Oncology

## 2014-09-08 VITALS — BP 131/76 | HR 93 | Temp 98.1°F | Resp 18 | Wt 168.8 lb

## 2014-09-08 DIAGNOSIS — Z90710 Acquired absence of both cervix and uterus: Secondary | ICD-10-CM | POA: Insufficient documentation

## 2014-09-08 DIAGNOSIS — Z09 Encounter for follow-up examination after completed treatment for conditions other than malignant neoplasm: Secondary | ICD-10-CM | POA: Diagnosis not present

## 2014-09-08 DIAGNOSIS — L409 Psoriasis, unspecified: Secondary | ICD-10-CM | POA: Diagnosis not present

## 2014-09-08 DIAGNOSIS — Z01411 Encounter for gynecological examination (general) (routine) with abnormal findings: Secondary | ICD-10-CM | POA: Diagnosis present

## 2014-09-08 DIAGNOSIS — Z800 Family history of malignant neoplasm of digestive organs: Secondary | ICD-10-CM | POA: Insufficient documentation

## 2014-09-08 DIAGNOSIS — Z1151 Encounter for screening for human papillomavirus (HPV): Secondary | ICD-10-CM | POA: Insufficient documentation

## 2014-09-08 DIAGNOSIS — Z803 Family history of malignant neoplasm of breast: Secondary | ICD-10-CM | POA: Insufficient documentation

## 2014-09-08 DIAGNOSIS — R87810 Cervical high risk human papillomavirus (HPV) DNA test positive: Secondary | ICD-10-CM | POA: Diagnosis present

## 2014-09-08 DIAGNOSIS — N893 Dysplasia of vagina, unspecified: Secondary | ICD-10-CM | POA: Insufficient documentation

## 2014-09-08 DIAGNOSIS — Z87891 Personal history of nicotine dependence: Secondary | ICD-10-CM | POA: Insufficient documentation

## 2014-09-08 NOTE — Patient Instructions (Signed)
We will call you with the results of your Pap smear. If the Pap smear is normal, you can followup with your gynecologist next year for routine screening.

## 2014-09-08 NOTE — Progress Notes (Signed)
Consult Note: Gyn-Onc  Lori Gordon 63 y.o. female  CC:  Chief Complaint  Patient presents with  . Vain ( vaginal intraepithelial neoplasia)    HPI: Patient is a 63 year old gravida 2 para 2 who we initially saw in May 2008 consult for vaginal dysplasia. She underwent hysterectomy 1999 for uterine fibroids. 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 She history of prior dysplasia treated in 1993. 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 Should a colposcopy with biopsies in 2007 that showed low-grade disease however Pap smears continue to see a high-grade dysplasia in 2007 she underwent CO2 laser of the vagina. Biopsies on the day of surgery should be a INH was treated with vaginal estrogen. She's previously also been treated with Efudex as well as Premarin cream without improvement of her Pap smears.  I last saw her 09/23/2013. Pap smear at that time was negative as was her exam. She comes in today for followup. She has stopped using her premarin cream. She has gained about 8# which she attributes to being very sedentary as she works from home and is at her desk for 10-12 hours a day. She does walk her dog twice a day.  She is not sexually active. She has had no bleeding. Her MMG is due next week. She is UTD on her colonoscopy and is due for one in 2 years. She is on the "5-year plan".  Her 10 point ROS is otherwise negative.   Current Meds:  Outpatient Encounter Prescriptions as of 09/08/2014  Medication Sig  . Calcium Carbonate-Vitamin D (CALCIUM + D PO) Take by mouth.    . Glucosamine-Chondroitin (GLUCOSAMINE CHONDR COMPLEX PO) Take by mouth.    . loratadine (CLARITIN REDITABS) 10 MG dissolvable tablet Take 10 mg by mouth daily.  . Multiple Vitamin (MULTIVITAMIN) capsule Take 1 capsule by mouth daily.    . NON FORMULARY "OCCULAR VITAMIN"   . conjugated estrogens (PREMARIN) vaginal cream Place vaginally daily.    Allergy:  Allergies  Allergen Reactions  .  Codeine     "STOMACH CRAMPS"    Social Hx:   History   Social History  . Marital Status: Married    Spouse Name: N/A    Number of Children: N/A  . Years of Education: N/A   Occupational History  . Not on file.   Social History Main Topics  . Smoking status: Former Research scientist (life sciences)  . Smokeless tobacco: Never Used  . Alcohol Use: 1.0 oz/week    2 drink(s) per week  . Drug Use: No  . Sexual Activity: Yes    Birth Control/ Protection: Surgical   Other Topics Concern  . Not on file   Social History Narrative  . No narrative on file    Past Surgical Hx:  Past Surgical History  Procedure Laterality Date  . Abdominal hysterectomy  1999    TAH  . Tubal ligation  1985  . Excision of cyst from chin    . C02 laser of vagina and vaginal biopsy  2007  . Broken leg  2007    Past Medical Hx:  Past Medical History  Diagnosis Date  . CIN I (cervical intraepithelial neoplasia I)   . Fibroid   . VAIN (vaginal intraepithelial neoplasia)   . VAIN III (vaginal intraepithelial neoplasia grade III)   . Psoriasis   . Macular degeneration, age related     Oncology Hx:   No history exists.    Family Hx:  Family History  Problem Relation Age of Onset  . Diabetes Mother   . Hypertension Mother   . Breast cancer Mother     Age 12  . Hypertension Father   . Heart disease Father   . Heart disease Paternal Grandmother   . Cancer Cousin     PATERNAL COUSINS W COLON CANCER  . Breast cancer Cousin     Mat.cousin Age 21  . Breast cancer Maternal Aunt     Age 47    Vitals:  Blood pressure 131/76, pulse 93, temperature 98.1 F (36.7 C), temperature source Oral, resp. rate 18, weight 168 lb 12.8 oz (76.567 kg).  Physical Exam: Well-nourished well-developed female in no acute distress.  Pelvic: Normal external female genitalia. Vagina is markedly atrophic. They're no gross visible lesions. ThinPrep Pap smear was submitted without difficulty.  Bimanual examination reveals no masses or  nodularity.  Assessment/Plan: 63 year old with history of VAIN . We will followup in results for Pap smear from today as well as HPV testing. We'll determine her disposition pending these results. Her exam and pap smear were negative in 11/14. If her pap smear is negative today, she can be released from our clinic and follow up with gynecology.  [name], MD 09/08/2014, 2:23 PM

## 2014-09-14 LAB — CYTOLOGY - PAP

## 2014-09-19 ENCOUNTER — Encounter: Payer: Self-pay | Admitting: Gynecologic Oncology

## 2014-09-22 ENCOUNTER — Telehealth: Payer: Self-pay | Admitting: *Deleted

## 2014-09-22 NOTE — Telephone Encounter (Signed)
-----   Message from Dorothyann Gibbs, NP sent at 09/20/2014 10:52 AM EST ----- Please let her know her pap smear showed low grade changes and she will need a repeat pap with colpo in six months with Dr. Alycia Rossetti. Schedule should be out for six months now. Thanks! Melissa  ----- Message -----    From: Lab in [place]: 09/14/2014   5:30 PM      To: Dorothyann Gibbs, NP

## 2014-09-22 NOTE — Telephone Encounter (Signed)
Called pt unable to reach. Lmovm to call office to schedule f/u appt

## 2015-02-23 ENCOUNTER — Ambulatory Visit: Payer: BLUE CROSS/BLUE SHIELD | Attending: Gynecologic Oncology | Admitting: Gynecologic Oncology

## 2015-02-23 ENCOUNTER — Other Ambulatory Visit (HOSPITAL_COMMUNITY)
Admission: RE | Admit: 2015-02-23 | Discharge: 2015-02-23 | Disposition: A | Discharge status: Home / Self Care | Payer: BLUE CROSS/BLUE SHIELD | Source: Ambulatory Visit | Attending: Gynecologic Oncology | Admitting: Gynecologic Oncology

## 2015-02-23 ENCOUNTER — Encounter: Payer: Self-pay | Admitting: Gynecologic Oncology

## 2015-02-23 VITALS — BP 125/81 | HR 79 | Temp 98.1°F | Resp 18 | Ht 61.0 in | Wt 173.5 lb

## 2015-02-23 DIAGNOSIS — Z1151 Encounter for screening for human papillomavirus (HPV): Secondary | ICD-10-CM | POA: Insufficient documentation

## 2015-02-23 DIAGNOSIS — H3530 Unspecified macular degeneration: Secondary | ICD-10-CM | POA: Insufficient documentation

## 2015-02-23 DIAGNOSIS — Z01411 Encounter for gynecological examination (general) (routine) with abnormal findings: Secondary | ICD-10-CM | POA: Diagnosis present

## 2015-02-23 DIAGNOSIS — Z90710 Acquired absence of both cervix and uterus: Secondary | ICD-10-CM | POA: Diagnosis not present

## 2015-02-23 DIAGNOSIS — L409 Psoriasis, unspecified: Secondary | ICD-10-CM | POA: Diagnosis not present

## 2015-02-23 DIAGNOSIS — N870 Mild cervical dysplasia: Secondary | ICD-10-CM | POA: Diagnosis present

## 2015-02-23 DIAGNOSIS — Z87891 Personal history of nicotine dependence: Secondary | ICD-10-CM | POA: Insufficient documentation

## 2015-02-23 DIAGNOSIS — N893 Dysplasia of vagina, unspecified: Secondary | ICD-10-CM | POA: Diagnosis not present

## 2015-02-23 NOTE — Patient Instructions (Signed)
We will call you with the results of your pap smear.

## 2015-02-23 NOTE — Progress Notes (Signed)
Consult Note: Gyn-Onc  Lori Gordon 64 y.o. female  CC:  Chief Complaint  Patient presents with  . VAIN    HPI: Patient is a 64 year old gravida 2 para 2 who we initially saw in May 2008 consult for vaginal dysplasia. She underwent hysterectomy 1999 for uterine fibroids. 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 She history of prior dysplasia treated in 1993. 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 Should a colposcopy with biopsies in 2007 that showed low-grade disease however Pap smears continue to see a high-grade dysplasia in 2007 she underwent CO2 laser of the vagina. She's previously also been treated with Efudex as well as Premarin cream without improvement of her Pap smears.  I saw her 09/23/2013 with negative colposcopy. Pap smear at that time was negative as was her exam. I last saw her in October 2015 at which time her Pap smear revealed low-grade dysplasia with high risk HPV. Colposcopy was not performed at that time. She comes in today for follow-up. She's gained another 5 pounds since we saw her in October. She states that she knows that she needs to be more active and start exercising more. She does have A. fib bit. She had a mammogram in the fall that was unremarkable. Her colonoscopy is due in about a year. She has no bleeding. She does have some pruritus on the vulva for which she uses vagisil.  Current Meds:  Outpatient Encounter Prescriptions as of 02/23/2015  Medication Sig  . benzocaine-resorcinol (VAGISIL) 5-2 % vaginal cream Place vaginally as needed for itching.  . Calcium Carbonate-Vitamin D (CALCIUM + D PO) Take by mouth.    . Glucosamine-Chondroitin (GLUCOSAMINE CHONDR COMPLEX PO) Take by mouth.    . loratadine (CLARITIN REDITABS) 10 MG dissolvable tablet Take 10 mg by mouth daily.  . Multiple Vitamin (MULTIVITAMIN) capsule Take 1 capsule by mouth daily.    . NON FORMULARY "OCCULAR VITAMIN"   . conjugated estrogens (PREMARIN) vaginal cream  Place vaginally daily.    Allergy:  Allergies  Allergen Reactions  . Codeine     "STOMACH CRAMPS"    Social Hx:   History   Social History  . Marital Status: Married    Spouse Name: N/A  . Number of Children: N/A  . Years of Education: N/A   Occupational History  . Not on file.   Social History Main Topics  . Smoking status: Former Research scientist (life sciences)  . Smokeless tobacco: Never Used  . Alcohol Use: 1.0 oz/week    2 drink(s) per week  . Drug Use: No  . Sexual Activity: Yes    Birth Control/ Protection: Surgical   Other Topics Concern  . Not on file   Social History Narrative    Past Surgical Hx:  Past Surgical History  Procedure Laterality Date  . Abdominal hysterectomy  1999    TAH  . Tubal ligation  1985  . Excision of cyst from chin    . C02 laser of vagina and vaginal biopsy  2007  . Broken leg  2007    Past Medical Hx:  Past Medical History  Diagnosis Date  . CIN I (cervical intraepithelial neoplasia I)   . Fibroid   . VAIN (vaginal intraepithelial neoplasia)   . VAIN III (vaginal intraepithelial neoplasia grade III)   . Psoriasis   . Macular degeneration, age related     Oncology Hx:   No history exists.    Family Hx:  Family History  Problem Relation  Age of Onset  . Diabetes Mother   . Hypertension Mother   . Breast cancer Mother     Age 89  . Hypertension Father   . Heart disease Father   . Heart disease Paternal Grandmother   . Cancer Cousin     PATERNAL COUSINS W COLON CANCER  . Breast cancer Cousin     Mat.cousin Age 42  . Breast cancer Maternal Aunt     Age 47    Vitals:  Blood pressure 125/81, pulse 79, temperature 98.1 F (36.7 C), temperature source Oral, resp. rate 18, height 5\' 1"  (1.549 m), weight 173 lb 8 oz (78.699 kg).  Physical Exam: Well-nourished well-developed female in no acute distress.  Pelvic: Normal external female genitalia. Vagina is markedly atrophic. They're no gross visible lesions. ThinPrep Pap smear was  submitted without difficulty. Colposcopic evaluation was performed after the application of acetic acid.   COLPOSCOPY THERE WAS A QUESTIONABLE SENSE OF AN ACETOWHITE CHANGE ON THE UPPER APEX OF THE VAGINAL VAULT AT 1 TO 3:00. THEREFORE, Colp ON THIS REGION. THERE WAS NO LUGOL'S NONSTAINING REGION.  Assessment/Plan: 64 year old with history of VAIN . We will followup in results for Pap smear from today as well as HPV testing. We'll determine her disposition pending these results.  Deshawnda Acrey A., MD 02/23/2015, 10:31 AM

## 2015-02-27 LAB — CYTOLOGY - PAP

## 2015-03-07 ENCOUNTER — Telehealth: Payer: Self-pay | Admitting: Gynecologic Oncology

## 2015-03-07 NOTE — Telephone Encounter (Signed)
Patient informed of pap smear results and Dr. Elenora Gamma recommendations for follow up in one year or sooner if needed.  She is advised to call in the fall to schedule her appt for April 2017 and to call for any questions or concerns.

## 2015-03-16 ENCOUNTER — Ambulatory Visit: Payer: Managed Care, Other (non HMO) | Admitting: Gynecologic Oncology

## 2015-04-22 IMAGING — CR DG CHEST 2V
2 series · 2 of 2 positions shown · non-contrast
Comparison: 11/14/2006

CLINICAL DATA: Prolonged cough for 1 month, nonsmoker

EXAM:
CHEST  2 VIEW

[view not recorded (1 of 2)]
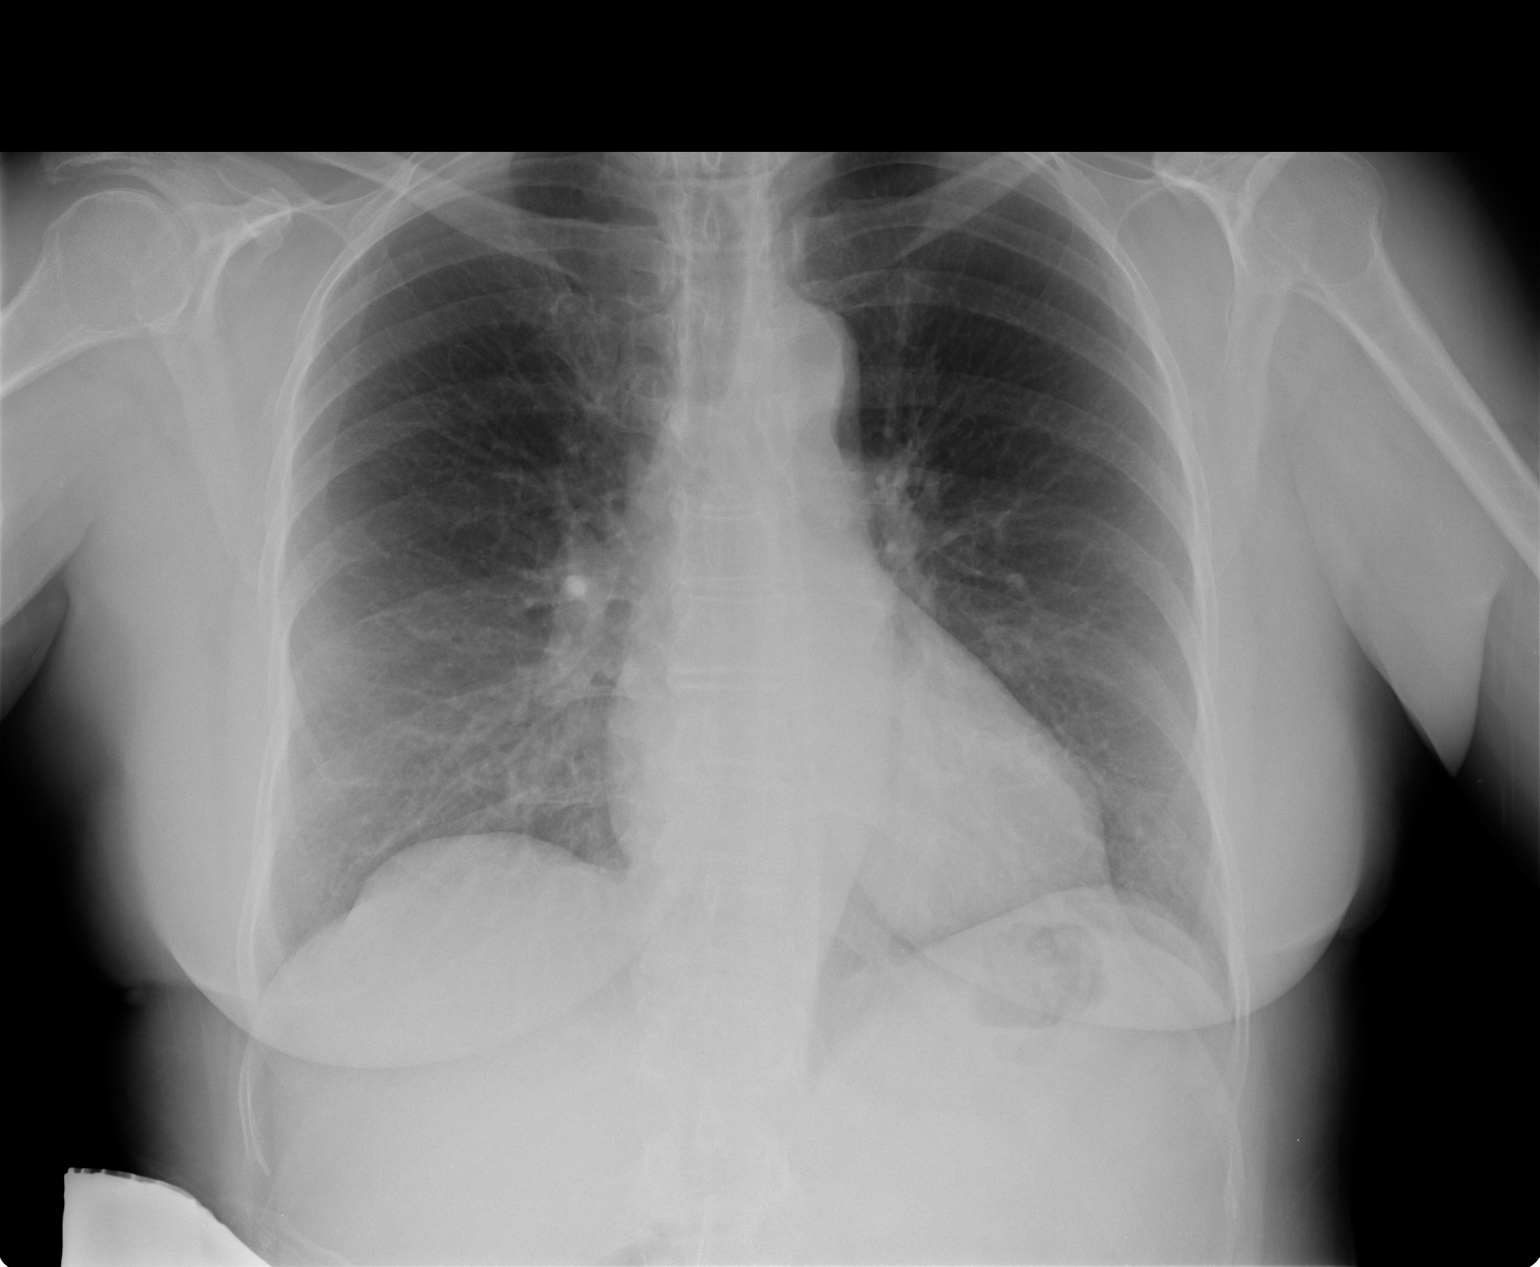

[view not recorded (2 of 2)]
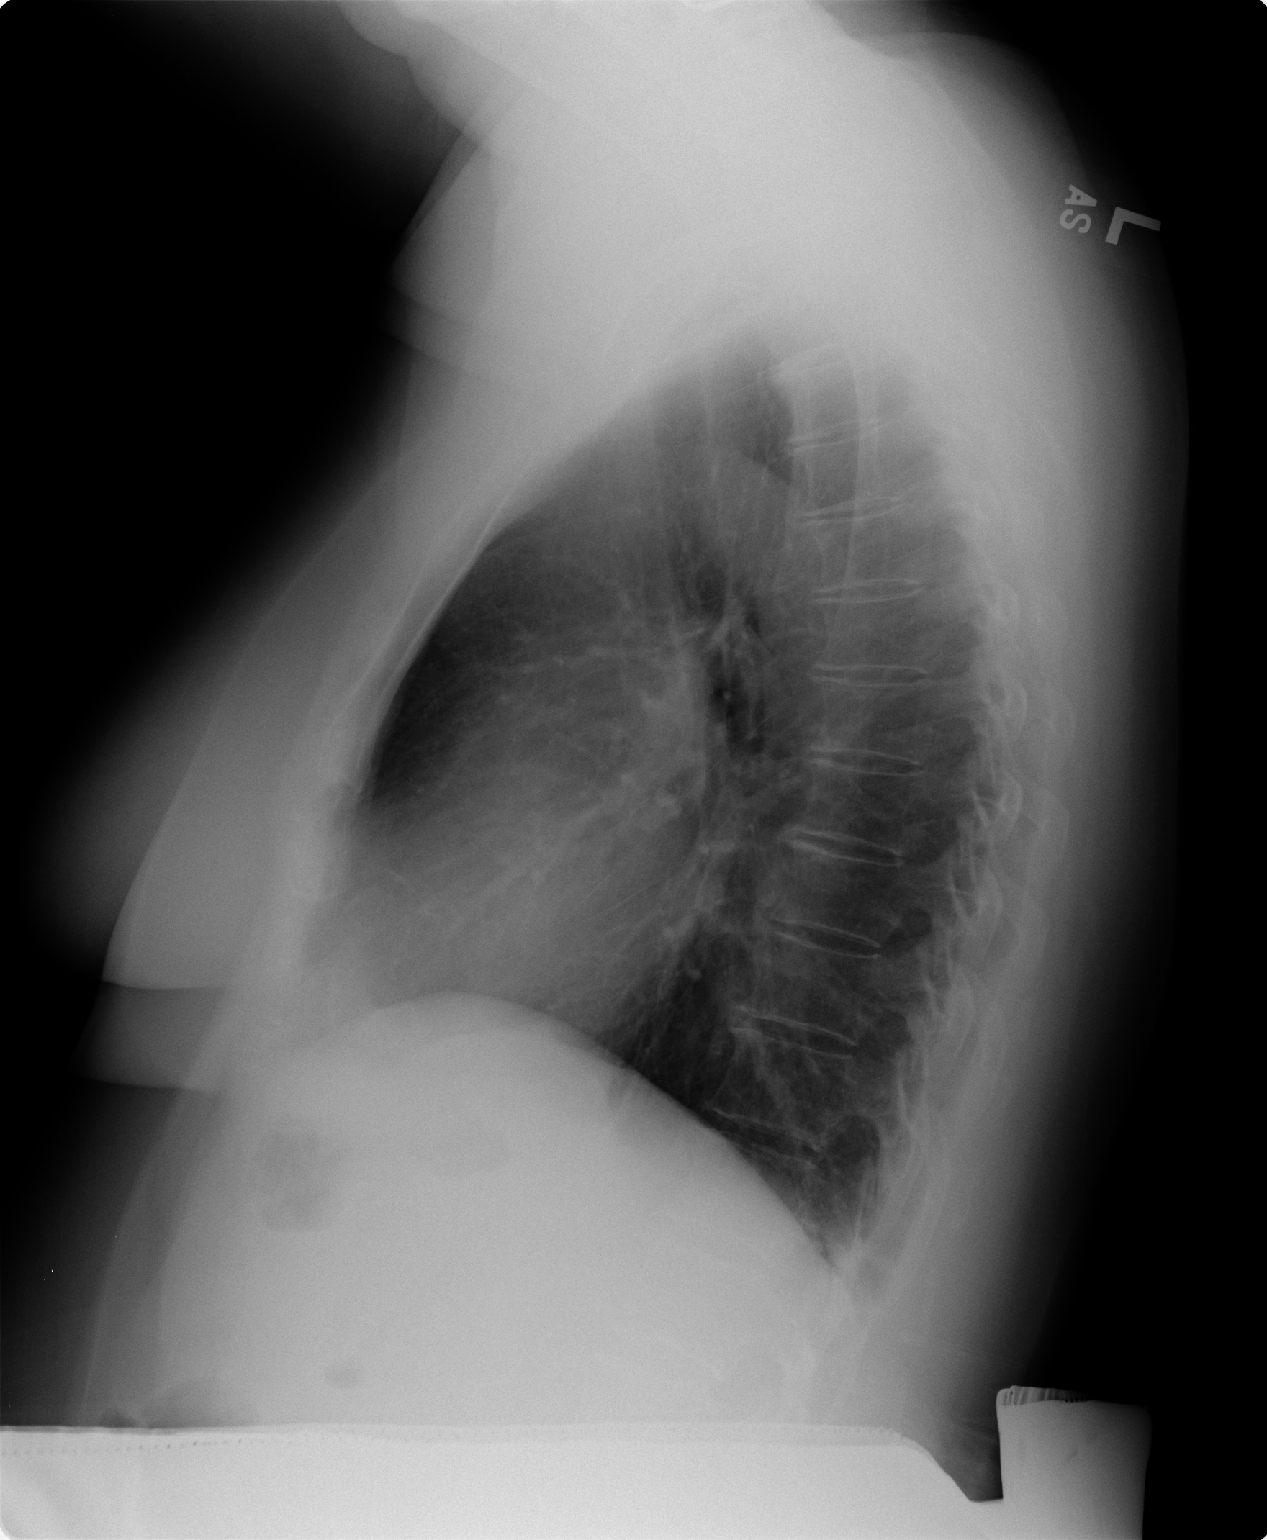

[2 of 2 positions shown; findings below may reference images not displayed]

FINDINGS: The heart size and mediastinal contours are within normal limits.
Both lungs are clear. The visualized skeletal structures are
unremarkable.
IMPRESSION: No active cardiopulmonary disease.

## 2015-10-03 ENCOUNTER — Other Ambulatory Visit: Payer: Self-pay | Admitting: Family Medicine

## 2015-10-03 ENCOUNTER — Ambulatory Visit
Admission: RE | Admit: 2015-10-03 | Discharge: 2015-10-03 | Disposition: A | Discharge status: Home / Self Care | Payer: BLUE CROSS/BLUE SHIELD | Source: Ambulatory Visit | Attending: Family Medicine | Admitting: Family Medicine

## 2015-10-03 DIAGNOSIS — R059 Cough, unspecified: Secondary | ICD-10-CM

## 2015-10-03 DIAGNOSIS — R05 Cough: Secondary | ICD-10-CM

## 2016-02-28 ENCOUNTER — Encounter: Payer: Self-pay | Admitting: Gynecologic Oncology

## 2016-02-28 ENCOUNTER — Ambulatory Visit: Payer: Medicare Other | Attending: Gynecologic Oncology | Admitting: Gynecologic Oncology

## 2016-02-28 ENCOUNTER — Other Ambulatory Visit (HOSPITAL_COMMUNITY)
Admission: RE | Admit: 2016-02-28 | Discharge: 2016-02-28 | Disposition: A | Discharge status: Home / Self Care | Payer: Medicare Other | Source: Ambulatory Visit | Attending: Gynecologic Oncology | Admitting: Gynecologic Oncology

## 2016-02-28 ENCOUNTER — Other Ambulatory Visit: Payer: Self-pay | Admitting: Gynecologic Oncology

## 2016-02-28 VITALS — BP 118/61 | HR 81 | Temp 98.0°F | Resp 18 | Ht 61.0 in | Wt 168.2 lb

## 2016-02-28 DIAGNOSIS — Z87891 Personal history of nicotine dependence: Secondary | ICD-10-CM | POA: Diagnosis not present

## 2016-02-28 DIAGNOSIS — Z79899 Other long term (current) drug therapy: Secondary | ICD-10-CM | POA: Insufficient documentation

## 2016-02-28 DIAGNOSIS — Z01411 Encounter for gynecological examination (general) (routine) with abnormal findings: Secondary | ICD-10-CM | POA: Insufficient documentation

## 2016-02-28 DIAGNOSIS — Z1151 Encounter for screening for human papillomavirus (HPV): Secondary | ICD-10-CM | POA: Diagnosis present

## 2016-02-28 DIAGNOSIS — Z800 Family history of malignant neoplasm of digestive organs: Secondary | ICD-10-CM | POA: Insufficient documentation

## 2016-02-28 DIAGNOSIS — N893 Dysplasia of vagina, unspecified: Secondary | ICD-10-CM | POA: Diagnosis not present

## 2016-02-28 DIAGNOSIS — H3530 Unspecified macular degeneration: Secondary | ICD-10-CM | POA: Diagnosis not present

## 2016-02-28 DIAGNOSIS — Z90710 Acquired absence of both cervix and uterus: Secondary | ICD-10-CM | POA: Insufficient documentation

## 2016-02-28 DIAGNOSIS — Z803 Family history of malignant neoplasm of breast: Secondary | ICD-10-CM | POA: Diagnosis not present

## 2016-02-28 NOTE — Progress Notes (Signed)
Consult Note: Gyn-Onc  Lori Gordon 65 y.o. female  CC:  Chief Complaint  Patient presents with  . VAIN    MD follow up visit    HPI: Patient is a 65 year old gravida 2 para 2 who we initially saw in May 2008 consult for vaginal dysplasia. She underwent hysterectomy 1999 for uterine fibroids. 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 She history of prior dysplasia treated in 1993. 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 Should a colposcopy with biopsies in 2007 that showed low-grade disease however Pap smears continue to see a high-grade dysplasia in 2007 she underwent CO2 laser of the vagina. She's previously also been treated with Efudex as well as Premarin cream without improvement of her Pap smears.  I saw her 09/23/2013 with negative colposcopy. Pap smear at that time was negative as was her exam. 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 At that time her Pap smear returned as atypical squamous cells of undetermined significance but she was high risk HPV negative.. Colposcopy was performed that did show questionable acetowhite epithelial area. Lugol's was placed and there is no Lugol's nonstaining region. She comes in today for follow-up.  She's overall doing quite well. Her brother did have a DVT and PE after a long drive to [place]. His thrombophilia workup was negative. There were seen by Dr. Laveda Norman and Sandford Craze secondary to the family history he has recommended BRCA testing for her mother which they will 2. The patient of course will let us know should her mother be BRCA positive. The patient herself is up-to-date on her mammograms. She had a colonoscopy in February. There were no polyps and they recommended follow-up in 5 years.  Her 10 point review of systems is negative.  Current Meds:  Outpatient Encounter Prescriptions as of 02/28/2016  Medication Sig   . Calcium Carbonate-Vitamin D (CALCIUM + D PO) Take by mouth.    . Glucosamine-Chondroitin (GLUCOSAMINE CHONDR COMPLEX PO) Take by mouth.    . Multiple Vitamin (MULTIVITAMIN) capsule Take 1 capsule by mouth daily.    . NON FORMULARY "OCCULAR VITAMIN"   . [DISCONTINUED] benzocaine-resorcinol (VAGISIL) 5-2 % vaginal cream Place vaginally as needed for itching.  . [DISCONTINUED] loratadine (CLARITIN REDITABS) 10 MG dissolvable tablet Take 10 mg by mouth daily.   No facility-administered encounter medications on file as of 02/28/2016.    Allergy:  Allergies  Allergen Reactions  . Codeine     "STOMACH CRAMPS"    Social Hx:   Social History   Social History  . Marital Status: Married    Spouse Name: N/A  . Number of Children: N/A  . Years of Education: N/A   Occupational History  . Not on file.   Social History Main Topics  . Smoking status: Former Research scientist (life sciences)  . Smokeless tobacco: Never Used  . Alcohol Use: 1.0 oz/week    2 drink(s) per week  . Drug Use: No  . Sexual Activity: Yes    Birth Control/ Protection: Surgical   Other Topics Concern  . Not on file   Social History Narrative    Past Surgical Hx:  Past Surgical History  Procedure Laterality Date  . Abdominal hysterectomy  1999    TAH  . Tubal ligation  1985  . Excision of cyst from chin    . C02 laser of vagina and vaginal  biopsy  2007  . Broken leg  2007    Past Medical Hx:  Past Medical History  Diagnosis Date  . CIN I (cervical intraepithelial neoplasia I)   . Fibroid   . VAIN (vaginal intraepithelial neoplasia)   . VAIN III (vaginal intraepithelial neoplasia grade III)   . Psoriasis   . Macular degeneration, age related     Oncology Hx:   No history exists.    Family Hx:  Family History  Problem Relation Age of Onset  . Diabetes Mother   . Hypertension Mother   . Breast cancer Mother     Age 54  . Hypertension Father   . Heart disease Father   . Heart disease Paternal Grandmother   .  Cancer Cousin     PATERNAL COUSINS W COLON CANCER  . Breast cancer Cousin     Mat.cousin Age 33  . Breast cancer Maternal Aunt     Age 100    Vitals:  Blood pressure 118/61, pulse 81, temperature 98 F (36.7 C), temperature source Oral, resp. rate 18, height _0  (1.549 m), weight 168 lb 3.2 oz (76.295 kg), SpO2 97 %.  Physical Exam: Well-nourished well-developed female in no acute distress.  Pelvic: Normal external female genitalia. Vagina is markedly atrophic. They're no gross visible lesions. ThinPrep Pap smear was submitted without difficulty. Bimanual examination reveals no nodularity or masses. There is no vaginal cuff tenderness.  Assessment/Plan: 65 year old with history of VAIN . We will followup in results for Pap smear from today as well as HPV testing. We'll determine her disposition pending these results. If her Pap smear continues to be HPV negative show no more than atypical cells return to see me in one year. Again, she will notify us if her mother is BRCA positive so we can ensure proper follow-up and screening for her. Leovardo Thoman A., MD 02/28/2016, 9:34 AM

## 2016-02-28 NOTE — Patient Instructions (Signed)
We will notify you with the results of your Pap smear and HPV typing from today.

## 2016-03-01 LAB — CYTOLOGY - PAP

## 2016-03-04 ENCOUNTER — Telehealth: Payer: Self-pay | Admitting: Gynecologic Oncology

## 2016-03-04 NOTE — Telephone Encounter (Signed)
Pt notified about pap results: negative.  No questions or concerns voiced.  Advised to call in the Fall to schedule an appt.

## 2016-05-26 IMAGING — CR DG CHEST 2V
2 series · 2 of 2 positions shown · non-contrast
Comparison: 08/29/2014

CLINICAL DATA: Cough

EXAM:
CHEST  2 VIEW

[w chest pa]
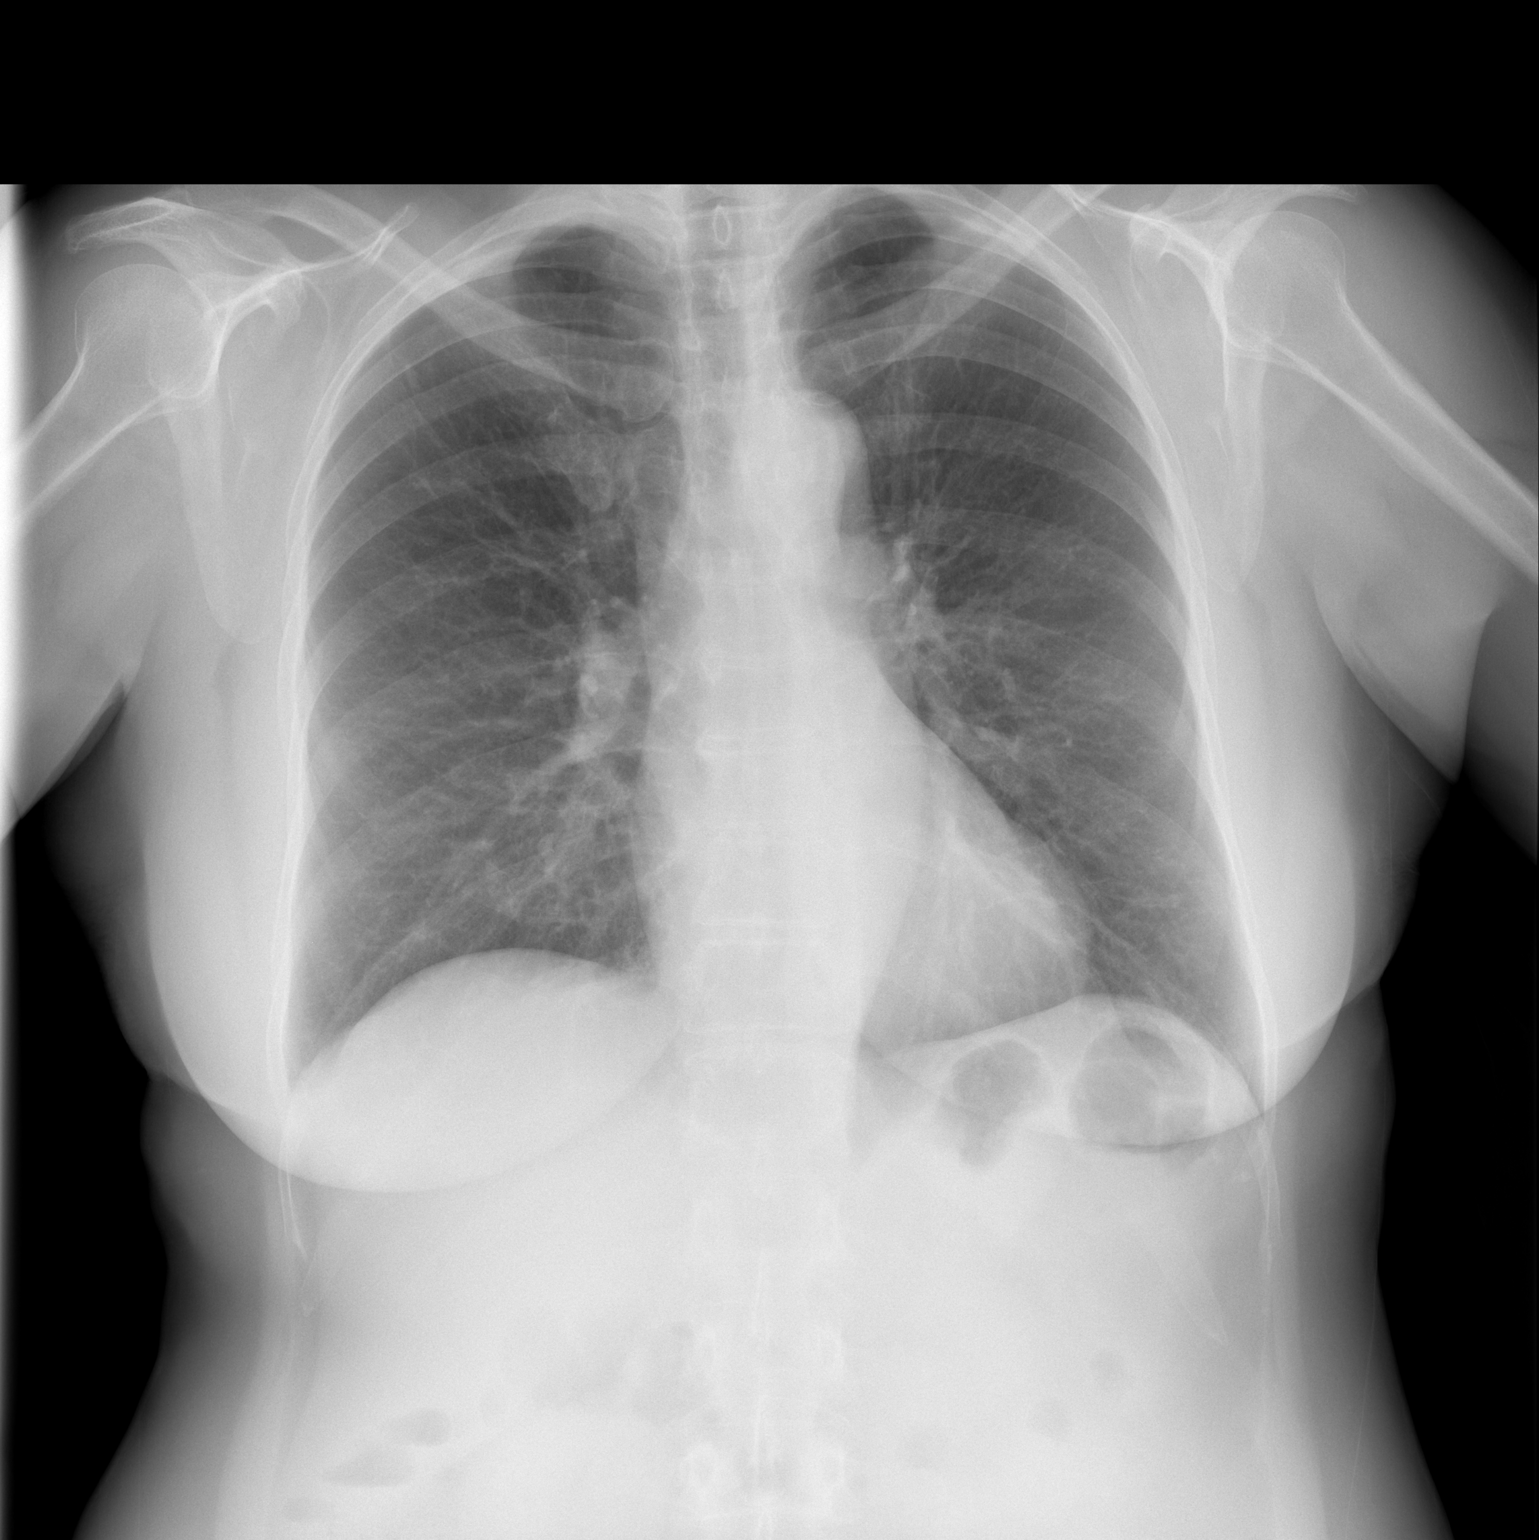

[w chest lat]
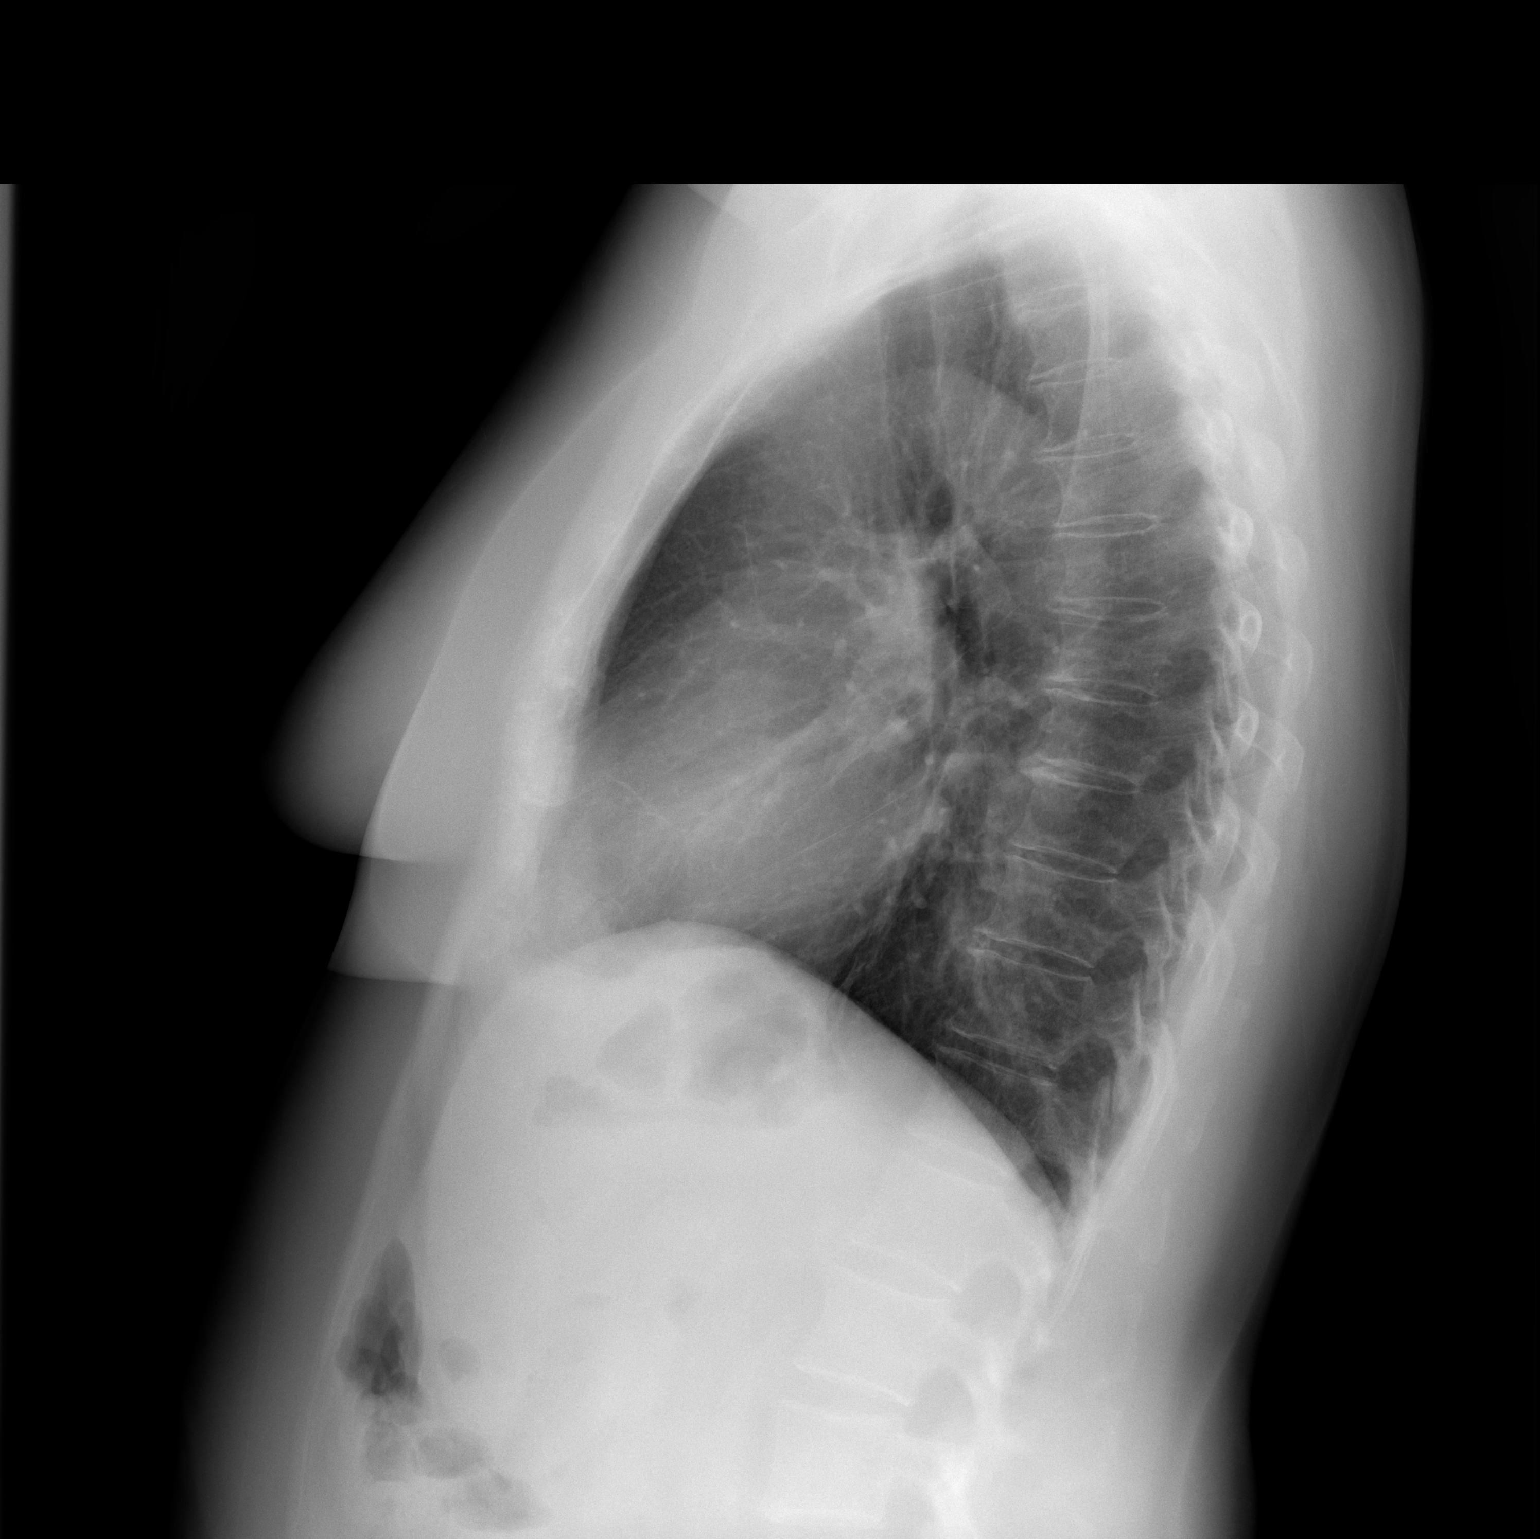

[2 of 2 positions shown; findings below may reference images not displayed]

FINDINGS: Cardiomediastinal silhouette is stable. No acute infiltrate or
pleural effusion. No pulmonary edema. Mild degenerative changes
thoracic spine.
IMPRESSION: No active cardiopulmonary disease. Mild degenerative changes
thoracic spine.

## 2017-03-12 ENCOUNTER — Other Ambulatory Visit: Payer: Self-pay | Admitting: Gynecologic Oncology

## 2017-03-12 ENCOUNTER — Other Ambulatory Visit (HOSPITAL_COMMUNITY)
Admission: RE | Admit: 2017-03-12 | Discharge: 2017-03-12 | Disposition: A | Discharge status: Home / Self Care | Payer: Medicare Other | Source: Ambulatory Visit | Attending: Gynecologic Oncology | Admitting: Gynecologic Oncology

## 2017-03-12 ENCOUNTER — Encounter: Payer: Self-pay | Admitting: Gynecologic Oncology

## 2017-03-12 ENCOUNTER — Ambulatory Visit: Payer: Medicare Other | Attending: Gynecologic Oncology | Admitting: Gynecologic Oncology

## 2017-03-12 VITALS — BP 146/67 | HR 88 | Temp 98.4°F | Resp 20 | Wt 171.9 lb

## 2017-03-12 DIAGNOSIS — Z833 Family history of diabetes mellitus: Secondary | ICD-10-CM | POA: Insufficient documentation

## 2017-03-12 DIAGNOSIS — L409 Psoriasis, unspecified: Secondary | ICD-10-CM | POA: Insufficient documentation

## 2017-03-12 DIAGNOSIS — Z87891 Personal history of nicotine dependence: Secondary | ICD-10-CM | POA: Diagnosis not present

## 2017-03-12 DIAGNOSIS — Z8249 Family history of ischemic heart disease and other diseases of the circulatory system: Secondary | ICD-10-CM | POA: Insufficient documentation

## 2017-03-12 DIAGNOSIS — Z853 Personal history of malignant neoplasm of breast: Secondary | ICD-10-CM | POA: Insufficient documentation

## 2017-03-12 DIAGNOSIS — N893 Dysplasia of vagina, unspecified: Secondary | ICD-10-CM | POA: Insufficient documentation

## 2017-03-12 DIAGNOSIS — Z885 Allergy status to narcotic agent status: Secondary | ICD-10-CM | POA: Diagnosis not present

## 2017-03-12 DIAGNOSIS — Z87411 Personal history of vaginal dysplasia: Secondary | ICD-10-CM | POA: Diagnosis not present

## 2017-03-12 DIAGNOSIS — Z90710 Acquired absence of both cervix and uterus: Secondary | ICD-10-CM | POA: Insufficient documentation

## 2017-03-12 DIAGNOSIS — Z09 Encounter for follow-up examination after completed treatment for conditions other than malignant neoplasm: Secondary | ICD-10-CM | POA: Insufficient documentation

## 2017-03-12 DIAGNOSIS — H3530 Unspecified macular degeneration: Secondary | ICD-10-CM | POA: Insufficient documentation

## 2017-03-12 NOTE — Patient Instructions (Signed)
We will notify you of the results of your Pap smear from today. If the Pap smear is normal you can be released from our clinic this will be the second consecutive normal Pap smear and you can follow-up with your primary care or gynecologic physicians.

## 2017-03-12 NOTE — Progress Notes (Signed)
Consult Note: Gyn-Onc  Labella P Aten 66 y.o. female  CC:  Chief Complaint  Patient presents with  . VAIN    HPI: Patient is a 66-year-old gravida 2 para 2 who we initially saw in May 2008 consult for vaginal dysplasia. She underwent hysterectomy 1999 for uterine fibroids. 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 She history of prior dysplasia treated in 1993. 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 Should a colposcopy with biopsies in 2007 that showed low-grade disease however Pap smears continue to see a high-grade dysplasia in 2007 she underwent CO2 laser of the vagina. She's previously also been treated with Efudex as well as Premarin cream without improvement of her Pap smears.  I saw her 09/23/2013 with negative colposcopy. Pap smear at that time was negative as was her exam. 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 At that time her Pap smear returned as atypical squamous cells of undetermined significance but she was high risk HPV negative.. Colposcopy was performed that did show questionable acetowhite epithelial area. Lugol's was placed and there is no Lugol's nonstaining region. She comes in today for follow-up.  She was last seen in 4/17. At that time her pap smear was negative and her HR-HPV was also negative.  She's overall doing quite well. Her brother did have a DVT and PE after a long drive to [place]. His thrombophilia workup was negative. There were seen by Dr. Mark Graham.  Her mother never ended up having the BRCA testing. However, since then she's a paternal first cousin who developed breast cancer at the age of 70. She also has a maternal first cousin with breast cancer at the age of 40. She was doing quite well and really denies any significant complaints. She had her last colonoscopy in February 2017 and they recommended follow-up in 5 years. She is  up-to-date on her mammograms. She would like for me to perform a breast exam today. She denies any bleeding. She has a change about bladder habits. She had questions regarding how her HPV type results change over time and I discussed this is a virus and that intermittently she might be able to clear the virus on her Pap smears are normal that appears to be the case and then her immune system does not allow her to clear the virus, the HPV returns in her Pap smears become abnormal again.   Current Meds:  Outpatient Encounter Prescriptions as of 03/12/2017  Medication Sig  . Calcium Carbonate-Vitamin D (CALCIUM + D PO) Take by mouth.    . Glucosamine-Chondroitin (GLUCOSAMINE CHONDR COMPLEX PO) Take by mouth.    . Multiple Vitamin (MULTIVITAMIN) capsule Take 1 capsule by mouth daily.    . NON FORMULARY "OCCULAR VITAMIN"    No facility-administered encounter medications on file as of 03/12/2017.     Allergy:  Allergies  Allergen Reactions  . Codeine     "STOMACH CRAMPS"    Social Hx:   Social History   Social History  . Marital status: Married    Spouse name: N/A  . Number of children: N/A  . Years of education: N/A   Occupational History  . Not on file.   Social History Main Topics  . Smoking status: Former Smoker  . Smokeless tobacco: Never Used  . Alcohol use 1.0 oz/week    2 Standard drinks or equivalent   per week  . Drug use: No  . Sexual activity: Yes    Birth control/ protection: Surgical   Other Topics Concern  . Not on file   Social History Narrative  . No narrative on file    Past Surgical Hx:  Past Surgical History:  Procedure Laterality Date  . ABDOMINAL HYSTERECTOMY  1999   TAH  . BROKEN LEG  2007  . C02 LASER OF VAGINA AND VAGINAL BIOPSY  2007  . EXCISION OF CYST FROM CHIN    . TUBAL LIGATION  1985    Past Medical Hx:  Past Medical History:  Diagnosis Date  . CIN I (cervical intraepithelial neoplasia I)   . Fibroid   . Macular degeneration, age  related   . Psoriasis   . VAIN (vaginal intraepithelial neoplasia)   . VAIN III (vaginal intraepithelial neoplasia grade III)     Oncology Hx:   No history exists.    Family Hx:  Family History  Problem Relation Age of Onset  . Diabetes Mother   . Hypertension Mother   . Breast cancer Mother     Age 70  . Hypertension Father   . Heart disease Father   . Heart disease Paternal Grandmother   . Cancer Cousin     PATERNAL COUSINS W COLON CANCER  . Breast cancer Cousin     Mat.cousin Age 40  . Breast cancer Maternal Aunt     Age 84    Vitals:  Blood pressure (!) 146/67, pulse 88, temperature 98.4 F (36.9 C), resp. rate 20, weight 171 lb 14.4 oz (78 kg).  Physical Exam: Well-nourished well-developed female in no acute distress.  Breasts: Supple, no palpable masses or skin changes. No nipple discharge. No axillary adenopathy.  Abdomen: Soft, nontender, nondistended. There are no palpable masses or hepatomegaly.  Pelvic: Normal external female genitalia. Vagina is markedly atrophic. They're no gross visible lesions. ThinPrep Pap smear was submitted without difficulty. Bimanual examination reveals no nodularity or masses. There is no vaginal cuff tenderness. Rectal confirms.  Assessment/Plan: 66-year-old with history of VAIN . We will followup in results for Pap smear from today as well as HPV testing. If her Pap smear is normal with negative HPV testing, she will be released from our clinic and can follow-up with her primary gynecologist. We will notify her of the results.  GEHRIG,PAOLA A., MD 03/12/2017, 2:40 PM  

## 2017-03-14 LAB — CYTOLOGY - PAP
Diagnosis: NEGATIVE
HPV: NOT DETECTED

## 2017-03-18 ENCOUNTER — Encounter: Payer: Self-pay | Admitting: Nurse Practitioner

## 2017-03-18 ENCOUNTER — Telehealth: Payer: Self-pay | Admitting: Nurse Practitioner

## 2017-03-18 NOTE — Telephone Encounter (Signed)
Called patient to inform her HPV not detected on most recent PAP per NP. Per Dr. Elenora Gamma note, upon normal PAP, she can be released to PCP or GYN for further care. She is informed to call our clinic for concerns or new issues. She verbalizes understanding and thanks for call.

## 2017-08-29 ENCOUNTER — Telehealth: Payer: Self-pay | Admitting: *Deleted

## 2017-08-29 NOTE — Telephone Encounter (Signed)
Attempted to return patient's call, [facility] for the patient for the patient to call the office back if needed. Left the patient the fax number to the office to fax a medical record release.

## 2017-09-02 ENCOUNTER — Telehealth: Payer: Self-pay | Admitting: *Deleted

## 2017-09-02 NOTE — Telephone Encounter (Signed)
Per patient request, patient chart faxed to [facility]

## 2021-01-02 DIAGNOSIS — E7800 Pure hypercholesterolemia, unspecified: Secondary | ICD-10-CM | POA: Diagnosis not present

## 2021-01-02 DIAGNOSIS — Z0000 Encounter for general adult medical examination without abnormal findings: Secondary | ICD-10-CM | POA: Diagnosis not present

## 2021-01-02 DIAGNOSIS — K589 Irritable bowel syndrome without diarrhea: Secondary | ICD-10-CM | POA: Diagnosis not present

## 2021-03-19 ENCOUNTER — Ambulatory Visit: Payer: Medicare Other

## 2021-05-09 DIAGNOSIS — D1801 Hemangioma of skin and subcutaneous tissue: Secondary | ICD-10-CM | POA: Diagnosis not present

## 2021-05-09 DIAGNOSIS — L821 Other seborrheic keratosis: Secondary | ICD-10-CM | POA: Diagnosis not present

## 2021-05-09 DIAGNOSIS — D225 Melanocytic nevi of trunk: Secondary | ICD-10-CM | POA: Diagnosis not present

## 2021-05-09 DIAGNOSIS — L649 Androgenic alopecia, unspecified: Secondary | ICD-10-CM | POA: Diagnosis not present

## 2021-05-09 DIAGNOSIS — D2262 Melanocytic nevi of left upper limb, including shoulder: Secondary | ICD-10-CM | POA: Diagnosis not present

## 2021-05-09 DIAGNOSIS — D2371 Other benign neoplasm of skin of right lower limb, including hip: Secondary | ICD-10-CM | POA: Diagnosis not present

## 2021-05-09 DIAGNOSIS — D2261 Melanocytic nevi of right upper limb, including shoulder: Secondary | ICD-10-CM | POA: Diagnosis not present

## 2021-05-09 DIAGNOSIS — D2372 Other benign neoplasm of skin of left lower limb, including hip: Secondary | ICD-10-CM | POA: Diagnosis not present

## 2021-06-07 ENCOUNTER — Ambulatory Visit: Payer: Medicare Other | Attending: Internal Medicine

## 2021-06-07 ENCOUNTER — Other Ambulatory Visit: Payer: Self-pay

## 2021-06-07 ENCOUNTER — Other Ambulatory Visit (HOSPITAL_BASED_OUTPATIENT_CLINIC_OR_DEPARTMENT_OTHER): Payer: Self-pay

## 2021-06-07 DIAGNOSIS — Z23 Encounter for immunization: Secondary | ICD-10-CM

## 2021-06-07 MED ORDER — PFIZER-BIONT COVID-19 VAC-TRIS 30 MCG/0.3ML IM SUSP
INTRAMUSCULAR | 0 refills | Status: DC
  Filled 2021-06-07: qty 0.3, 1d supply, fill #0

## 2021-06-07 NOTE — Progress Notes (Signed)
   Covid-19 Vaccination Clinic  Name:  Lori Gordon    MRN: [id number] DOB: 1951/11/17  06/07/2021  [name] was observed post Covid-19 immunization for 15 minutes without incident. She was provided with Vaccine Information Sheet and instruction to access the V-Safe system.   [name] was instructed to call 911 with any severe reactions post vaccine: Difficulty breathing  Swelling of face and throat  A fast heartbeat  A bad rash all over body  Dizziness and weakness   Immunizations Administered     Name Date Dose VIS Date Route   PFIZER Comrnaty(Gray TOP) Covid-19 Vaccine 06/07/2021 11:05 AM 0.3 mL 10/26/2020 Intramuscular   Manufacturer: [employer]   Lot: [id number]   [place]: [phone]

## 2021-08-25 ENCOUNTER — Other Ambulatory Visit: Payer: Self-pay

## 2021-08-25 ENCOUNTER — Emergency Department (HOSPITAL_BASED_OUTPATIENT_CLINIC_OR_DEPARTMENT_OTHER)
Admission: EM | Admit: 2021-08-25 | Discharge: 2021-08-26 | Disposition: A | Discharge status: Home / Self Care | Payer: Medicare Other | Attending: Emergency Medicine | Admitting: Emergency Medicine

## 2021-08-25 DIAGNOSIS — Z87891 Personal history of nicotine dependence: Secondary | ICD-10-CM | POA: Diagnosis not present

## 2021-08-25 DIAGNOSIS — Z7901 Long term (current) use of anticoagulants: Secondary | ICD-10-CM | POA: Diagnosis not present

## 2021-08-25 DIAGNOSIS — I4891 Unspecified atrial fibrillation: Secondary | ICD-10-CM | POA: Diagnosis not present

## 2021-08-25 DIAGNOSIS — I248 Other forms of acute ischemic heart disease: Secondary | ICD-10-CM | POA: Diagnosis not present

## 2021-08-25 DIAGNOSIS — R002 Palpitations: Secondary | ICD-10-CM | POA: Diagnosis present

## 2021-08-25 LAB — CBC WITH DIFFERENTIAL/PLATELET
Abs Immature Granulocytes: 0.02 10*3/uL (ref 0.00–0.07)
Basophils Absolute: 0.1 10*3/uL (ref 0.0–0.1)
Basophils Relative: 1 %
Eosinophils Absolute: 0.2 10*3/uL (ref 0.0–0.5)
Eosinophils Relative: 4 %
HCT: 43.3 % (ref 36.0–46.0)
Hemoglobin: 14.4 g/dL (ref 12.0–15.0)
Immature Granulocytes: 0 %
Lymphocytes Relative: 36 %
Lymphs Abs: 2.4 10*3/uL (ref 0.7–4.0)
MCH: 29.4 pg (ref 26.0–34.0)
MCHC: 33.3 g/dL (ref 30.0–36.0)
MCV: 88.5 fL (ref 80.0–100.0)
Monocytes Absolute: 0.5 10*3/uL (ref 0.1–1.0)
Monocytes Relative: 8 %
Neutro Abs: 3.4 10*3/uL (ref 1.7–7.7)
Neutrophils Relative %: 51 %
Platelets: 334 10*3/uL (ref 150–400)
RBC: 4.89 MIL/uL (ref 3.87–5.11)
RDW: 13 % (ref 11.5–15.5)
WBC: 6.6 10*3/uL (ref 4.0–10.5)
nRBC: 0 % (ref 0.0–0.2)

## 2021-08-25 LAB — BASIC METABOLIC PANEL
Anion gap: 11 (ref 5–15)
BUN: 17 mg/dL (ref 8–23)
CO2: 27 mmol/L (ref 22–32)
Calcium: 9.5 mg/dL (ref 8.9–10.3)
Chloride: 104 mmol/L (ref 98–111)
Creatinine, Ser: 0.9 mg/dL (ref 0.44–1.00)
GFR, Estimated: >60 mL/min (ref >60)
Glucose, Bld: 117 mg/dL — ABNORMAL HIGH (ref 70–99)
Potassium: 3.3 mmol/L — ABNORMAL LOW (ref 3.5–5.1)
Sodium: 142 mmol/L (ref 135–145)

## 2021-08-25 LAB — TROPONIN I (HIGH SENSITIVITY): Troponin I (High Sensitivity): 8 ng/L (ref <18)

## 2021-08-25 MED ORDER — ADENOSINE 6 MG/2ML IV SOLN
INTRAVENOUS | Status: AC
  Filled 2021-08-25: qty 4

## 2021-08-25 MED ORDER — ADENOSINE 6 MG/2ML IV SOLN
INTRAVENOUS | Status: DC
Start: 2021-08-25 — End: 2021-08-26
  Filled 2021-08-25: qty 4

## 2021-08-25 MED ORDER — DILTIAZEM HCL-DEXTROSE 125-5 MG/125ML-% IV SOLN (PREMIX)
5.0000 mg/h | INTRAVENOUS | Status: DC
  Administered 2021-08-25: 5 mg/h via INTRAVENOUS
  Filled 2021-08-25: qty 125

## 2021-08-25 MED ORDER — DILTIAZEM LOAD VIA INFUSION
20.0000 mg | Freq: Once | INTRAVENOUS | Status: AC
  Administered 2021-08-25: 20 mg via INTRAVENOUS
  Filled 2021-08-25: qty 20

## 2021-08-25 NOTE — ED Provider Notes (Addendum)
[facility] Provider Note: Georgena Spurling, MD, FACEP  CSN: [id number] MRN: [id number] ARRIVAL: 08/25/21 at 2242 ROOM: DB010/DB010   CHIEF COMPLAINT  Palpitations   HISTORY OF PRESENT ILLNESS  08/25/21 10:57 PM Lori Gordon is a 70 y.o. female who felt the sudden onset of pain in her bilateral jaws about 10 PM.  The pain was a burning pain and was moderate in severity.  She then noticed that she had a rapid heartbeat.  She denies any associated chest pain, shortness of breath or lightheadedness.  Nothing made her symptoms better or worse.  The jaw pain resolved on its own in about 10 minutes.  On arrival here the patient was noted to have a heart rate in the 160s.   Past Medical History:  Diagnosis Date   CIN I (cervical intraepithelial neoplasia I)    Fibroid    Macular degeneration, age related    Psoriasis    VAIN (vaginal intraepithelial neoplasia)    VAIN III (vaginal intraepithelial neoplasia grade III)     Past Surgical History:  Procedure Laterality Date   ABDOMINAL HYSTERECTOMY  1999   TAH   BROKEN LEG  2007   C02 LASER OF VAGINA AND VAGINAL BIOPSY  2007   EXCISION OF CYST FROM CHIN     TUBAL LIGATION  1985    Family History  Problem Relation Age of Onset   Diabetes Mother    Hypertension Mother    Breast cancer Mother        Age 74   Hypertension Father    Heart disease Father    Heart disease Paternal Grandmother    Cancer Cousin        PATERNAL COUSINS W COLON CANCER   Breast cancer Cousin        Mat.cousin Age 17   Breast cancer Maternal Aunt        Age 12    Social History   Tobacco Use   Smoking status: Former   Smokeless tobacco: Never  Substance Use Topics   Alcohol use: Yes    Alcohol/week: 2.0 standard drinks    Types: 2 Standard drinks or equivalent per week   Drug use: No    Prior to Admission medications   Medication Sig Start Date End Date Taking? Authorizing Provider  metoprolol tartrate (LOPRESSOR) 50 MG tablet  Take 0.5 tablets (25 mg total) by mouth 2 (two) times daily. 08/26/21  Yes Malikah Principato, Jenny Reichmann, MD  RIVAROXABAN Alveda Reasons) VTE STARTER PACK (15 & 20 MG) Follow package directions: Take one 67m tablet by mouth twice a day. On day 22, switch to one 247mtablet once a day. Take with food. 08/26/21  Yes Hibba Schram, MD  Calcium Carbonate-Vitamin D (CALCIUM + D PO) Take by mouth.      [provider]  COVID-19 mRNA Vac-TriS, Pfizer, (PFIZER-BIONT COVID-19 VAC-TRIS) SUSP injection Inject into the muscle. 06/07/21   SnCarlyle BasquesMD  Glucosamine-Chondroitin (GLUCOSAMINE CHONDR COMPLEX PO) Take by mouth.      [provider]  Multiple Vitamin (MULTIVITAMIN) capsule Take 1 capsule by mouth daily.      [provider]  NON FORMULARY "OCCULAR VITAMIN"     [provider]    Allergies Codeine   REVIEW OF SYSTEMS  Negative except as noted here or in the History of Present Illness.   PHYSICAL EXAMINATION  Initial Vital Signs Pulse (!) 154, temperature 98 F (36.7 C), temperature source Oral, resp. rate 20, height _0  (  1.549 m), weight 78 kg, SpO2 98 %.  Examination General: Well-developed, well-nourished female in no acute distress; appearance consistent with age of record HENT: normocephalic; atraumatic Eyes: pupils equal, round and reactive to light; extraocular muscles intact Neck: supple Heart: Irregular rhythm; tachycardia Lungs: clear to auscultation bilaterally Abdomen: soft; nondistended; nontender; bowel sounds present Extremities: No deformity; full range of motion; pulses normal Neurologic: Awake, alert and oriented; motor function intact in all extremities and symmetric; no facial droop Skin: Warm and dry Psychiatric: Normal mood and affect   RESULTS  Summary of this visit's results, reviewed and interpreted by myself:   EKG Interpretation  Date/Time:  Saturday August 25 2021 22:54:31 EDT Ventricular Rate:  172 PR Interval:  76 QRS  Duration: 101 QT Interval:  274 QTC Calculation: 456 R Axis:   21 Text Interpretation: Afib with RVR Abnormal R-wave progression, early transition ST depression, probably rate related Baseline wander in lead(s) II Previously NSR Confirmed by Shanon Rosser [phone]) on 08/25/2021 10:57:22 PM       Laboratory Studies: Results for orders placed or performed during the hospital encounter of 08/25/21 (from the past 24 hour(s))  CBC with Differential/Platelet     Status: None   Collection Time: 08/25/21 10:57 PM  Result Value Ref Range   WBC 6.6 4.0 - 10.5 K/uL   RBC 4.89 3.87 - 5.11 MIL/uL   Hemoglobin 14.4 12.0 - 15.0 g/dL   HCT 43.3 36.0 - 46.0 %   MCV 88.5 80.0 - 100.0 fL   MCH 29.4 26.0 - 34.0 pg   MCHC 33.3 30.0 - 36.0 g/dL   RDW 13.0 11.5 - 15.5 %   Platelets 334 150 - 400 K/uL   nRBC 0.0 0.0 - 0.2 %   Neutrophils Relative % 51 %   Neutro Abs 3.4 1.7 - 7.7 K/uL   Lymphocytes Relative 36 %   Lymphs Abs 2.4 0.7 - 4.0 K/uL   Monocytes Relative 8 %   Monocytes Absolute 0.5 0.1 - 1.0 K/uL   Eosinophils Relative 4 %   Eosinophils Absolute 0.2 0.0 - 0.5 K/uL   Basophils Relative 1 %   Basophils Absolute 0.1 0.0 - 0.1 K/uL   Immature Granulocytes 0 %   Abs Immature Granulocytes 0.02 0.00 - 0.07 K/uL  Basic metabolic panel     Status: Abnormal   Collection Time: 08/25/21 10:57 PM  Result Value Ref Range   Sodium 142 135 - 145 mmol/L   Potassium 3.3 (L) 3.5 - 5.1 mmol/L   Chloride 104 98 - 111 mmol/L   CO2 27 22 - 32 mmol/L   Glucose, Bld 117 (H) 70 - 99 mg/dL   BUN 17 8 - 23 mg/dL   Creatinine, Ser 0.90 0.44 - 1.00 mg/dL   Calcium 9.5 8.9 - 10.3 mg/dL   GFR, Estimated >60 >60 mL/min   Anion gap 11 5 - 15  Troponin I (High Sensitivity)     Status: None   Collection Time: 08/25/21 10:57 PM  Result Value Ref Range   Troponin I (High Sensitivity) 8 <18 ng/L  Troponin I (High Sensitivity)     Status: Abnormal   Collection Time: 08/26/21  1:05 AM  Result Value Ref Range    Troponin I (High Sensitivity) 56 (H) <18 ng/L   Imaging Studies: No results found.  ED COURSE and MDM  Nursing notes, initial and subsequent vitals signs, including pulse oximetry, reviewed and interpreted by myself.  Vitals:   08/26/21 0445 08/26/21 0500 08/26/21  0515 08/26/21 0545  BP: 109/70 91/66 (!) 87/69 119/86  Pulse: (!) 58 (!) 57 75 100  Resp: 16 19 (!) 24 13  Temp:      TempSrc:      SpO2: 96% 97% 97% 98%  Weight:      Height:       Medications  adenosine (ADENOCARD) 6 MG/2ML injection (  Not Given 08/25/21 2304)  diltiazem (CARDIZEM) 1 mg/mL load via infusion 20 mg (20 mg Intravenous Bolus from Bag 08/25/21 2315)    And  diltiazem (CARDIZEM) 125 mg in dextrose 5% 125 mL (1 mg/mL) infusion (0 mg/hr Intravenous Stopped 08/26/21 0315)  rivaroxaban (XARELTO) tablet 20 mg (has no administration in time range)  rivaroxaban (XARELTO) Education Kit for Afib patients (has no administration in time range)  metoprolol tartrate (LOPRESSOR) tablet 25 mg (has no administration in time range)  metoprolol tartrate (LOPRESSOR) injection 5 mg (5 mg Intravenous Given 08/26/21 0239)  calcium gluconate 1 g/ 50 mL sodium chloride IVPB (0 mg Intravenous Stopped 08/26/21 0200)   CHA2DS2-VASc Score = 2  The patient's score is based upon: CHF History: 0 HTN History: 0 Diabetes History: 0 Stroke History: 0 Vascular Disease History: 0 Age Score: 1 Gender Score: 1    ASSESSMENT AND PLAN: Persistent Atrial Fibrillation (ICD10:  I48.19) The patient's CHA2DS2-VASc score is 2, indicating a 2.2% annual risk of stroke.    Signed,  Karen Chafe Geeta Dworkin, MD    08/26/2021 5:52 AM      12:06 AM Patient given Cardizem bolus and is now on drip.  Rate is still not well controlled.  12:38 AM We will start Lopressor boluses in addition to Cardizem drip.  4:20 AM Patient has been off the Cardizem drip for over an hour.  Her rate is better controlled now.  Rate tends to be in the 70s through low teens  averaging about the 90s.  The patient is able to ambulate without any lightheadedness.  She is having no chest pain or shortness of breath.  5:45 AM Although the patient is not as well rate controlled as I was like she is asymptomatic and is able to ambulate without lightheadedness, shortness of breath or chest pain.  She would prefer to go home at this time.  We will start her on metoprolol 25 mg twice daily for rate control and we will start her on Xarelto for anticoagulation.  She will follow-up with her primary care physician, Donnie Coffin, MD, tomorrow.  We will also refer her to cardiology.  She lives very close to this facility and will return if she becomes symptomatic.  She has a cardiac monitoring watch which she can use to follow her heart rate and rhythm.  Mild elevation of second troponin likely due to demand ischemia, not unexpected given the rate she was in earlier.  PROCEDURES  Procedures CRITICAL CARE Performed by: Karen Chafe Lisette Mancebo Total critical care time: 90 minutes Critical care time was exclusive of separately billable procedures and treating other patients. Critical care was necessary to treat or prevent imminent or life-threatening deterioration. Critical care was time spent personally by me on the following activities: development of treatment plan with patient and/or surrogate as well as nursing, discussions with consultants, evaluation of patient's response to treatment, examination of patient, obtaining history from patient or surrogate, ordering and performing treatments and interventions, ordering and review of laboratory studies, ordering and review of radiographic studies, pulse oximetry and re-evaluation of patient's condition.   ED DIAGNOSES  ICD-10-CM   1. Atrial fibrillation with rapid ventricular response (HCC)  I48.91     2. New onset atrial fibrillation (HCC)  I48.91     3. Demand ischemia ([place])  I24.8          Taijah Macrae, MD 08/26/21 0551     Shanon Rosser, MD 08/26/21 [phone]

## 2021-08-25 NOTE — ED Triage Notes (Signed)
Jaw pain , palpitation  1 hour ago , took 1 aspirin. Denies chest pain nor shortness of breath .

## 2021-08-26 LAB — TROPONIN I (HIGH SENSITIVITY): Troponin I (High Sensitivity): 56 ng/L — ABNORMAL HIGH (ref <18)

## 2021-08-26 MED ORDER — METOPROLOL TARTRATE 25 MG PO TABS
25.0000 mg | ORAL_TABLET | Freq: Once | ORAL | Status: AC
  Administered 2021-08-26: 25 mg via ORAL
  Filled 2021-08-26: qty 1

## 2021-08-26 MED ORDER — CALCIUM GLUCONATE-NACL 1-0.675 GM/50ML-% IV SOLN
1.0000 g | Freq: Once | INTRAVENOUS | Status: AC
  Administered 2021-08-26: 1000 mg via INTRAVENOUS
  Filled 2021-08-26: qty 50

## 2021-08-26 MED ORDER — RIVAROXABAN (XARELTO) VTE STARTER PACK (15 & 20 MG): ORAL_TABLET | ORAL | 0 refills | Status: DC

## 2021-08-26 MED ORDER — RIVAROXABAN 20 MG PO TABS
20.0000 mg | ORAL_TABLET | Freq: Once | ORAL | Status: AC
  Administered 2021-08-26: 20 mg via ORAL
  Filled 2021-08-26: qty 1

## 2021-08-26 MED ORDER — SODIUM CHLORIDE 0.9 % IV SOLN: 1.0000 g | Freq: Once | INTRAVENOUS | Status: DC

## 2021-08-26 MED ORDER — METOPROLOL TARTRATE 5 MG/5ML IV SOLN
5.0000 mg | INTRAVENOUS | Status: AC | PRN
  Administered 2021-08-26 (×3): 5 mg via INTRAVENOUS
  Filled 2021-08-26 (×2): qty 5

## 2021-08-26 MED ORDER — RIVAROXABAN (XARELTO) EDUCATION KIT FOR AFIB PATIENTS
PACK | Freq: Once | Status: AC
  Filled 2021-08-26: qty 1

## 2021-08-26 MED ORDER — METOPROLOL TARTRATE 50 MG PO TABS: 25.0000 mg | ORAL_TABLET | Freq: Two times a day (BID) | ORAL | 0 refills | Status: DC

## 2021-08-26 NOTE — Progress Notes (Signed)
Patient has been contacted and instructed that she needs to take Xarelto 20mg  daily for the indication of AF and to disregard the VTE starter pack prescription information.  A new prescription has been phoned into the patient's pharmacy of choice under [facility]. Patient expresses understanding of importance of taking correctly.  Lorelei Pont, PharmD, BCPS 08/26/2021 10:34 AM ED Clinical Pharmacist -  [phone]

## 2021-08-27 DIAGNOSIS — R778 Other specified abnormalities of plasma proteins: Secondary | ICD-10-CM | POA: Diagnosis not present

## 2021-08-27 DIAGNOSIS — E876 Hypokalemia: Secondary | ICD-10-CM | POA: Diagnosis not present

## 2021-08-27 DIAGNOSIS — I480 Paroxysmal atrial fibrillation: Secondary | ICD-10-CM | POA: Diagnosis not present

## 2021-09-21 ENCOUNTER — Ambulatory Visit: Payer: Medicare Other | Admitting: Internal Medicine

## 2021-09-21 ENCOUNTER — Encounter: Payer: Self-pay | Admitting: Internal Medicine

## 2021-09-21 ENCOUNTER — Other Ambulatory Visit: Payer: Self-pay

## 2021-09-21 ENCOUNTER — Telehealth: Payer: Self-pay | Admitting: Internal Medicine

## 2021-09-21 VITALS — BP 110/70 | Resp 20 | Ht 61.0 in | Wt 168.4 lb

## 2021-09-21 DIAGNOSIS — I4891 Unspecified atrial fibrillation: Secondary | ICD-10-CM | POA: Diagnosis not present

## 2021-09-21 MED ORDER — RIVAROXABAN 20 MG PO TABS: 20.0000 mg | ORAL_TABLET | Freq: Every day | ORAL | 3 refills | Status: DC

## 2021-09-21 MED ORDER — METOPROLOL TARTRATE 50 MG PO TABS: 25.0000 mg | ORAL_TABLET | Freq: Two times a day (BID) | ORAL | 3 refills | Status: DC

## 2021-09-21 NOTE — Telephone Encounter (Signed)
Pt was seen in office today. Medication were reviewed at ov. RX was sent to pts pharmacy as directed.

## 2021-09-21 NOTE — Patient Instructions (Signed)
Medication Instructions:  Your physician recommends that you continue on your current medications as directed. Please refer to the Current Medication list given to you today.  *If you need a refill on your cardiac medications before your next appointment, please call your pharmacy*  Testing/Procedures: Your physician has requested that you have an echocardiogram. Echocardiography is a painless test that uses sound waves to create images of your heart. It provides your doctor with information about the size and shape of your heart and how well your heart's chambers and valves are working. This procedure takes approximately one hour. There are no restrictions for this procedure.  This will be done at our [facility] location:  [place]: At [employer], you and your health needs are our priority.  As part of our continuing mission to provide you with exceptional heart care, we have created designated Provider Care Teams.  These Care Teams include your primary Cardiologist (physician) and Advanced Practice Providers (APPs -  Physician Assistants and Nurse Practitioners) who all work together to provide you with the care you need, when you need it.  We recommend signing up for the patient portal called "MyChart".  Sign up information is provided on this After Visit Summary.  MyChart is used to connect with patients for Virtual Visits (Telemedicine).  Patients are able to view lab/test results, encounter notes, upcoming appointments, etc.  Non-urgent messages can be sent to your provider as well.   To learn more about what you can do with MyChart, go to [email or web address].    Your next appointment:   3 month(s)  The format for your next appointment:   In Person  Provider:   Dr. Harl Bowie

## 2021-09-21 NOTE — Progress Notes (Signed)
Cardiology Office Note:    Date:  09/21/2021   ID:  Lori [place], DOB 02-27-1951, MRN [id number]  PCP:  Aurea Graff.Marlou Sa, MD   [place] Providers Cardiologist:  None     Referring MD: Alroy Dust, Carlean Jews.Marlou Sa, MD   No chief complaint on file. Paroxysmal Atrial fibrillation  History of Present Illness:    Lori Gordon is a 69 y.o. female with a hx of atrial fibrillation on xarelto, CHADS2VASC 2  Lori Gordon presented with the ED with afib with RVR 08/25/2021 managed with diltiazem gtt, IV metop. Her heart rate was up to 170s.  She presented at [place], rates improved ; she reports still in atrial fibrillation, no 12 lead.  Her trop was negative 8 and then 56. She was discharged on metop 25 mg BID, xarelto 20 mg daily. Her PCP checked her thyroid levels and were normal. Crt 0.9. ON 10/10 after being in the ER, her palpitations stopped. She documented her rates and rhythm from her smart watch and she had sinus rhythm rates high 50s-70s.  Her palpitations started at 10 PM at night. She was watching television. She felt pain in her jaw. She has no CP or SOB. She had some fatigue afterwards. She had some headaches. They have eased up. She feels better. Her blood pressure is normal.  No LE edema. No orthopnea. Father had BAV and valve replacement with bioprosthesis at 64. Her brothers had BAV and had a mechanical valve replacement. Her brother has a pacemaker at 90 years of age. Her children are healthy. Smoked in early 80s, then stopped. Social etoh.  She does yoga and is planning to travel to [place].   TSH 2.6  Past Medical History:  Diagnosis Date   CIN I (cervical intraepithelial neoplasia I)    Fibroid    Macular degeneration, age related    Psoriasis    VAIN (vaginal intraepithelial neoplasia)    VAIN III (vaginal intraepithelial neoplasia grade III)     Past Surgical History:  Procedure Laterality Date   ABDOMINAL HYSTERECTOMY  1999   TAH   BROKEN LEG  2007   C02 LASER OF VAGINA  AND VAGINAL BIOPSY  2007   EXCISION OF CYST FROM CHIN     TUBAL LIGATION  1985    Current Medications: Current Meds  Medication Sig   Calcium Carbonate-Vitamin D (CALCIUM + D PO) Take by mouth.     Glucosamine-Chondroitin (GLUCOSAMINE CHONDR COMPLEX PO) Take by mouth.     metoprolol tartrate (LOPRESSOR) 50 MG tablet Take 0.5 tablets (25 mg total) by mouth 2 (two) times daily.   montelukast (SINGULAIR) 10 MG tablet Take 10 mg by mouth daily as needed.   Multiple Vitamin (MULTIVITAMIN) capsule Take 1 capsule by mouth daily.     Multiple Vitamins-Minerals (PRESERVISION AREDS 2) CAPS See admin instructions.   NON FORMULARY "OCCULAR VITAMIN"    rivaroxaban (XARELTO) 20 MG TABS tablet Take 20 mg by mouth daily.   RIVAROXABAN (XARELTO) VTE STARTER PACK (15 & 20 MG) Follow package directions: Take one 15mg  tablet by mouth twice a day. On day 22, switch to one 20mg  tablet once a day. Take with food.     Allergies:   Codeine   Social History   Socioeconomic History   Marital status: Married    Spouse name: Not on file   Number of children: Not on file   Years of education: Not on file   Highest education level: Not on file  Occupational History  Not on file  Tobacco Use   Smoking status: Former   Smokeless tobacco: Never  Substance and Sexual Activity   Alcohol use: Yes    Alcohol/week: 2.0 standard drinks    Types: 2 Standard drinks or equivalent per week   Drug use: No   Sexual activity: Yes    Birth control/protection: Surgical  Other Topics Concern   Not on file  Social History Narrative   Not on file   Social Determinants of Health   Financial Resource Strain: Not on file  Food Insecurity: Not on file  Transportation Needs: Not on file  Physical Activity: Not on file  Stress: Not on file  Social Connections: Not on file     Family History: The patient's family history includes Breast cancer in her cousin, maternal aunt, and mother; Cancer in her cousin; Diabetes  in her mother; Heart disease in her father and paternal grandmother; Hypertension in her father and mother.  ROS:   Please see the history of present illness.     All other systems reviewed and are negative.  EKGs/Labs/Other Studies Reviewed:    The following studies were reviewed today:   EKG:  EKG is  ordered today.  The ekg ordered today demonstrates  NSR, no ischemic changes. Qtc 406 ms   Recent Labs: 08/25/2021: BUN 17; Creatinine, Ser 0.90; Hemoglobin 14.4; Platelets 334; Potassium 3.3; Sodium 142  Recent Lipid Panel No results found for: CHOL, TRIG, HDL, CHOLHDL, VLDL, LDLCALC, LDLDIRECT   Risk Assessment/Calculations:    CHA2DS2-VASc Score = 2   This indicates a 2.2% annual risk of stroke. The patient's score is based upon: CHF History: 0 HTN History: 0 Diabetes History: 0 Stroke History: 0 Vascular Disease History: 0 Age Score: 1 Gender Score: 1          Physical Exam:    VS:  Resp 20   Ht 5\' 1"  (1.549 m)   Wt 168 lb 6.4 oz (76.4 kg)   SpO2 98%   BMI 31.82 kg/m     Wt Readings from Last 3 Encounters:  09/21/21 168 lb 6.4 oz (76.4 kg)  08/25/21 172 lb (78 kg)  03/12/17 171 lb 14.4 oz (78 kg)     GEN:  Well nourished, well developed in no acute distress HEENT: Normal NECK: No JVD; No carotid bruits CARDIAC: RRR, no murmurs, rubs, gallops Vasc: 2+ radial BL RESPIRATORY:  Clear to auscultation without rales, wheezing or rhonchi  ABDOMEN: Soft, non-tender, non-distended MUSCULOSKELETAL:  No edema; No deformity  SKIN: Warm and dry NEUROLOGIC:  Alert and oriented x 3 PSYCHIATRIC:  Normal affect   ASSESSMENT:   #Atrial Fibrillation: In sinus rhythm. Chads2VASC 2 on xarelto. Recommend sleep study with PCP for OSA, she mentioned that she has a visit in February. She mentioned wanting to do yoga , exercise, and traveling, which I said were all fine. Will continue BB for rate control and xarelto. She is tolerating the medications well.    Plan  TTE Follow up 3 months      Medication Adjustments/Labs and Tests Ordered: Current medicines are reviewed at length with the patient today.  Concerns regarding medicines are outlined above.   Signed, Janina Mayo, MD  09/21/2021 10:33 AM    [facility]

## 2021-09-21 NOTE — Telephone Encounter (Signed)
*  STAT* If patient is at the pharmacy, call can be transferred to refill team.   1. Which medications need to be refilled? (please list name of each medication and dose if known) metoprolol tartrate (LOPRESSOR) 50 MG tablet  rivaroxaban (XARELTO) 20 MG TABS tablet  2. Which pharmacy/location (including street and city if local pharmacy) is medication to be sent to? [facility] [place], [place] AT [address]  3. Do they need a 30 day or 90 day supply? Lori Gordon

## 2021-09-24 MED ORDER — METOPROLOL TARTRATE 25 MG PO TABS: 25.0000 mg | ORAL_TABLET | Freq: Two times a day (BID) | ORAL | 3 refills | Status: DC

## 2021-09-24 MED ORDER — METOPROLOL TARTRATE 50 MG PO TABS: 25.0000 mg | ORAL_TABLET | Freq: Two times a day (BID) | ORAL | 3 refills | Status: DC

## 2021-10-09 ENCOUNTER — Ambulatory Visit (HOSPITAL_COMMUNITY): Payer: Medicare Other | Attending: Internal Medicine

## 2021-10-09 ENCOUNTER — Other Ambulatory Visit: Payer: Self-pay

## 2021-10-09 DIAGNOSIS — I4891 Unspecified atrial fibrillation: Secondary | ICD-10-CM | POA: Insufficient documentation

## 2021-10-10 DIAGNOSIS — Z1231 Encounter for screening mammogram for malignant neoplasm of breast: Secondary | ICD-10-CM | POA: Diagnosis not present

## 2021-10-10 LAB — ECHOCARDIOGRAM COMPLETE
Area-P 1/2: 3.77 cm2
P 1/2 time: 400 msec
S' Lateral: 2.6 cm

## 2021-10-15 ENCOUNTER — Encounter: Payer: Self-pay | Admitting: Internal Medicine

## 2021-12-24 DIAGNOSIS — Z800 Family history of malignant neoplasm of digestive organs: Secondary | ICD-10-CM | POA: Diagnosis not present

## 2021-12-24 DIAGNOSIS — Z8679 Personal history of other diseases of the circulatory system: Secondary | ICD-10-CM | POA: Diagnosis not present

## 2021-12-24 DIAGNOSIS — Z8371 Family history of colonic polyps: Secondary | ICD-10-CM | POA: Diagnosis not present

## 2021-12-25 ENCOUNTER — Ambulatory Visit: Payer: Medicare Other | Admitting: Internal Medicine

## 2021-12-25 ENCOUNTER — Telehealth: Payer: Self-pay | Admitting: *Deleted

## 2021-12-25 NOTE — Telephone Encounter (Signed)
° °  Patient Name: Lori Gordon  DOB: [birth year] MRN: [id number]  Primary Cardiologist: Janina Mayo, MD  Chart reviewed as part of pre-operative protocol coverage.   The patient has an upcoming appointment scheduled 01/01/22 with Dr. Phineas Inches at which time this clearance can be addressed in case there are any issues that would impact surgical recommendations.   I added preop FYI to appointment notes so that Dr. Harl Bowie is aware to address at time of OV.   Per office protocol, the cardiology provider should forward their finalized clearance decision and recommendations regarding antiplatelet/anticoagulation to requesting party below. I will loop in our pharmacy team so that the recommendation regarding Xarelto is available to Dr. Harl Bowie to include in her note at time of OV. Otherwise I will route this message as FYI to requesting party and remove this message from the pre-op box as separate preop APP input not needed at this time.  Please call with questions.  Charlie Pitter, PA-C 12/25/2021, 1:35 PM

## 2021-12-25 NOTE — Telephone Encounter (Signed)
° °  Pre-operative Risk Assessment    Patient Name: Lori Gordon  DOB: 1951-02-05 MRN: [id number]      Request for Surgical Clearance    Procedure:   colonoscopy  Date of Surgery:  Clearance 03/19/22                                 Surgeon:  Dr Alessandra Bevels Surgeon's Group or Practice Name:  [facility] Phone number:  [phone] Fax number:  [phone]   Type of Clearance Requested:   - Pharmacy:  Hold Rivaroxaban (Xarelto) for 2 days   Type of Anesthesia:   propofol   Additional requests/questions:      Arman Filter   12/25/2021, 7:21 AM

## 2021-12-26 NOTE — Telephone Encounter (Signed)
Patient with diagnosis of afib on Xarelto for anticoagulation.    Procedure: colonoscopy Date of procedure: 03/19/22  CHA2DS2-VASc Score = 2  This indicates a 2.2% annual risk of stroke. The patient's score is based upon: CHF History: 0 HTN History: 0 Diabetes History: 0 Stroke History: 0 Vascular Disease History: 0 Age Score: 1 Gender Score: 1     CrCl 97mL/min using adjusted body weight Platelet count 334K  Per office protocol, patient can hold Xarelto for 2 days prior to procedure as requested.

## 2021-12-26 NOTE — Telephone Encounter (Signed)
Noted. This information is now available to Dr. Harl Bowie to review and include in their final recommendations if appropriate at upcoming [place].

## 2022-01-01 ENCOUNTER — Ambulatory Visit: Payer: Medicare Other | Admitting: Internal Medicine

## 2022-01-01 ENCOUNTER — Encounter: Payer: Self-pay | Admitting: Internal Medicine

## 2022-01-01 ENCOUNTER — Other Ambulatory Visit: Payer: Self-pay

## 2022-01-01 VITALS — BP 100/66 | HR 63 | Ht 61.0 in | Wt 170.6 lb

## 2022-01-01 DIAGNOSIS — R0683 Snoring: Secondary | ICD-10-CM

## 2022-01-01 DIAGNOSIS — I4891 Unspecified atrial fibrillation: Secondary | ICD-10-CM | POA: Diagnosis not present

## 2022-01-01 NOTE — Progress Notes (Signed)
Cardiology Office Note:    Date:  01/01/2022   ID:  Lori [place], DOB 12/31/1950, MRN [id number]  PCP:  Aurea Graff.Marlou Sa, MD   [place] Providers Cardiologist:  Janina Mayo, MD     Referring MD: Aurea Graff.Marlou Sa, MD   No chief complaint on file. Paroxysmal Atrial fibrillation  History of Present Illness:   Initial HPI  Lori Gordon is a 71 y.o. female with a hx of atrial fibrillation on xarelto, CHADS2VASC 2  Lori Gordon presented with the ED with afib with RVR 08/25/2021 managed with diltiazem gtt, IV metop. Her heart rate was up to 170s.  She presented [place] [facility], rates improved ; she reports still in atrial fibrillation, no 12 lead.  Her trop was negative 8 and then 56. She was discharged on metop 25 mg BID, xarelto 20 mg daily. Her PCP checked her thyroid levels and were normal. Crt 0.9. ON 10/10 after being in the ER, her palpitations stopped. She documented her rates and rhythm from her smart watch and she had sinus rhythm rates high 50s-70s.  Her palpitations started at 10 PM at night. She was watching television. She felt pain in her jaw. She has no CP or SOB. She had some fatigue afterwards. She had some headaches. They have eased up. She feels better. Her blood pressure is normal.  No LE edema. No orthopnea. Father had BAV and valve replacement with bioprosthesis at 62. Her brothers had BAV and had a mechanical valve replacement. Her brother has a pacemaker at 52 years of age. Her children are healthy. Smoked in early 80s, then stopped. Social etoh.  She does yoga and is planning to travel to [place].  TSH 2.6  Interim Hx: She has a few questions about the source of afib. She notes some brief SOB in the morning. She's in normal rhythm today. She feels palpitations at times. Otherwise she is asymptomatic.  Cardiology Studies TTE 10/09/2021- normal EF/RV. Normal strain.No pulmonary HTN. No valve disease. Normal LA size.  Past Medical History:  Diagnosis Date   CIN I  (cervical intraepithelial neoplasia I)    Fibroid    Macular degeneration, age related    Psoriasis    VAIN (vaginal intraepithelial neoplasia)    VAIN III (vaginal intraepithelial neoplasia grade III)     Past Surgical History:  Procedure Laterality Date   ABDOMINAL HYSTERECTOMY  1999   TAH   BROKEN LEG  2007   C02 LASER OF VAGINA AND VAGINAL BIOPSY  2007   EXCISION OF CYST FROM CHIN     TUBAL LIGATION  1985    Current Medications: No outpatient medications have been marked as taking for the 01/01/22 encounter (Appointment) with Janina Mayo, MD.     Allergies:   Codeine   Social History   Socioeconomic History   Marital status: Married    Spouse name: Not on file   Number of children: Not on file   Years of education: Not on file   Highest education level: Not on file  Occupational History   Not on file  Tobacco Use   Smoking status: Former   Smokeless tobacco: Never  Substance and Sexual Activity   Alcohol use: Yes    Alcohol/week: 2.0 standard drinks    Types: 2 Standard drinks or equivalent per week   Drug use: No   Sexual activity: Yes    Birth control/protection: Surgical  Other Topics Concern   Not on file  Social History Narrative  Not on file   Social Determinants of Health   Financial Resource Strain: Not on file  Food Insecurity: Not on file  Transportation Needs: Not on file  Physical Activity: Not on file  Stress: Not on file  Social Connections: Not on file     Family History: The patient's family history includes Breast cancer in her cousin, maternal aunt, and mother; Cancer in her cousin; Diabetes in her mother; Heart disease in her father and paternal grandmother; Hypertension in her mother.  ROS:   Please see the history of present illness.     All other systems reviewed and are negative.  EKGs/Labs/Other Studies Reviewed:    The following studies were reviewed today:   EKG:  EKG is  ordered today.  The ekg ordered today  demonstrates   NSR, no ischemic changes. Qtc 406 ms  NSR   Recent Labs: 08/25/2021: BUN 17; Creatinine, Ser 0.90; Hemoglobin 14.4; Platelets 334; Potassium 3.3; Sodium 142  Recent Lipid Panel No results found for: CHOL, TRIG, HDL, CHOLHDL, VLDL, LDLCALC, LDLDIRECT   Risk Assessment/Calculations:    CHA2DS2-VASc Score = 2   This indicates a 2.2% annual risk of stroke. The patient's score is based upon: CHF History: 0 HTN History: 0 Diabetes History: 0 Stroke History: 0 Vascular Disease History: 0 Age Score: 1 Gender Score: 1          Physical Exam:    VS:   Vitals:   01/01/22 0853  BP: 100/66  Pulse: 63  SpO2: 96%    Wt Readings from Last 3 Encounters:  09/21/21 168 lb 6.4 oz (76.4 kg)  08/25/21 172 lb (78 kg)  03/12/17 171 lb 14.4 oz (78 kg)     GEN:  Well nourished, well developed in no acute distress HEENT: Normal NECK: No JVD CARDIAC: RRR, no murmurs, rubs, gallops Vasc: 2+ radial BL RESPIRATORY:  Clear to auscultation without rales, wheezing or rhonchi  ABDOMEN: Soft, non-tender, non-distended MUSCULOSKELETAL:  No edema; No deformity  SKIN: Warm and dry NEUROLOGIC:  Alert and oriented x 3 PSYCHIATRIC:  Normal affect   ASSESSMENT:    #Paroxysmal Atrial Fibrillation: In sinus rhythm. Chads2VASC 2 on xarelto. She mentioned wanting to do yoga , exercise, and traveling, which I said were all fine in the past. She also noted snoring, will plan for sleep study. Will continue BB for rate control and xarelto. She plans to call her insurance to review cost whether she can switch to eliquis. She is tolerating the medications well. In terms of AAD, can likely be a candidate for flecainide/PVI. We discussed that if she has palpitations to check her heart rates, if >120 for a couple of hours she can go to the ED. - for colonoscopy patient can hold her xarelto for as long as necessary for the procedure (2 days). She can restart per GI - Sleep study referral -may  change to eliquis after her xarelto refill runs out - cont BB   Plan:  Sleep Study Follow up in 6 months      Medication Adjustments/Labs and Tests Ordered: Current medicines are reviewed at length with the patient today.  Concerns regarding medicines are outlined above.   Signed, Janina Mayo, MD  01/01/2022 7:49 AM    [facility]

## 2022-01-01 NOTE — Patient Instructions (Addendum)
Medication Instructions:  No Changes In Medications at this time.  *If you need a refill on your cardiac medications before your next appointment, please call your pharmacy*  Testing/Procedures: Your physician has recommended that you have a sleep study. This test records several body functions during sleep, including: brain activity, eye movement, oxygen and carbon dioxide blood levels, heart rate and rhythm, breathing rate and rhythm, the flow of air through your mouth and nose, snoring, body muscle movements, and chest and belly movement.  Follow-Up: At [facility], you and your health needs are our priority.  As part of our continuing mission to provide you with exceptional heart care, we have created designated Provider Care Teams.  These Care Teams include your primary Cardiologist (physician) and Advanced Practice Providers (APPs -  Physician Assistants and Nurse Practitioners) who all work together to provide you with the care you need, when you need it.  Your next appointment:   6 month(s)  The format for your next appointment:   In Person  Provider:   Janina Mayo, MD    REFERRAL TO PULMONARY FOR SLEEP

## 2022-01-04 DIAGNOSIS — E7800 Pure hypercholesterolemia, unspecified: Secondary | ICD-10-CM | POA: Diagnosis not present

## 2022-01-04 DIAGNOSIS — I480 Paroxysmal atrial fibrillation: Secondary | ICD-10-CM | POA: Diagnosis not present

## 2022-01-04 DIAGNOSIS — Z0000 Encounter for general adult medical examination without abnormal findings: Secondary | ICD-10-CM | POA: Diagnosis not present

## 2022-01-21 DIAGNOSIS — H4602 Optic papillitis, left eye: Secondary | ICD-10-CM | POA: Diagnosis not present

## 2022-01-21 DIAGNOSIS — H2513 Age-related nuclear cataract, bilateral: Secondary | ICD-10-CM | POA: Diagnosis not present

## 2022-01-21 DIAGNOSIS — H3540 Unspecified peripheral retinal degeneration: Secondary | ICD-10-CM | POA: Diagnosis not present

## 2022-01-23 ENCOUNTER — Ambulatory Visit (INDEPENDENT_AMBULATORY_CARE_PROVIDER_SITE_OTHER): Payer: Medicare Other | Admitting: Primary Care

## 2022-01-23 ENCOUNTER — Encounter: Payer: Self-pay | Admitting: Primary Care

## 2022-01-23 ENCOUNTER — Other Ambulatory Visit: Payer: Self-pay

## 2022-01-23 VITALS — BP 124/72 | HR 69 | Temp 98.9°F | Ht 61.0 in | Wt 169.0 lb

## 2022-01-23 DIAGNOSIS — R0683 Snoring: Secondary | ICD-10-CM

## 2022-01-23 DIAGNOSIS — I480 Paroxysmal atrial fibrillation: Secondary | ICD-10-CM | POA: Diagnosis not present

## 2022-01-23 NOTE — Assessment & Plan Note (Signed)
-   Stable; Regular rate and rhythm on exam ?- Continue Xarelto '20mg'$  daily and metoprolol tartrate '25mg'$  daily ?- Following with cardiology  ?

## 2022-01-23 NOTE — Assessment & Plan Note (Addendum)
-  Patient has symptoms of loud snoring. Dx with afib ib October 2022, currently in sinus rhythm. Epworth 2.  Concern patient could have obstructive sleep apnea, needs home sleep study to evaluate. Discussed risk of untreated sleep apnea including cardiac arrhythmias, pulm HTN, stroke, DM. We briefly reviewed treatment options. Encouraged patient to work on weight loss efforts and focus on side sleeping position/elevate head of bed. Advised against driving if experiencing excessive daytime sleepiness. Follow-up in 4-6 weeks to review sleep study results and discuss treatment options further.  ?

## 2022-01-23 NOTE — Patient Instructions (Addendum)
?Sleep apnea is defined as period of 10 seconds or longer when you stop breathing at night. This can happen multiple times a night. Dx sleep apnea is when this occurs more than 5 times an hour.  ?  ?Mild OSA 5-15 apneic events an hour ?Moderate OSA 15-30 apneic events an hour ?Severe OSA > 30 apneic events an hour ?  ?Untreated sleep apnea puts you at higher risk for cardiac arrhythmias such as Afib, pulmonary HTN, stroke and diabetes ?  ?Treatment options include weight loss, side sleeping position, oral appliance, CPAP therapy or referral to ENT for possible surgical options  ?  ?Recommendations: ?Focus on side sleeping position ?Work on weight loss efforts  ?Do not drive if experiencing excessive daytime sleepiness of fatigue  ?  ?Orders: ?Home sleep study re: snoring  ?  ?Follow-up: ?4-6 week visit to review sleep study results and discuss treatment options further ? ? ? ?Sleep Apnea ?Sleep apnea affects breathing during sleep. It causes breathing to stop for 10 seconds or more, or to become shallow. People with sleep apnea usually snore loudly. ?It can also increase the risk of: ?Heart attack. ?Stroke. ?Being very overweight (obese). ?Diabetes. ?Heart failure. ?Irregular heartbeat. ?High blood pressure. ?The goal of treatment is to help you breathe normally again. ?What are the causes? ?The most common cause of this condition is a collapsed or blocked airway. ?There are three kinds of sleep apnea: ?Obstructive sleep apnea. This is caused by a blocked or collapsed airway. ?Central sleep apnea. This happens when the brain does not send the right signals to the muscles that control breathing. ?Mixed sleep apnea. This is a combination of obstructive and central sleep apnea. ?What increases the risk? ?Being overweight. ?Smoking. ?Having a small airway. ?Being older. ?Being female. ?Drinking alcohol. ?Taking medicines to calm yourself (sedatives or tranquilizers). ?Having family members with the condition. ?Having a  tongue or tonsils that are larger than normal. ?What are the signs or symptoms? ?Trouble staying asleep. ?Loud snoring. ?Headaches in the morning. ?Waking up gasping. ?Dry mouth or sore throat in the morning. ?Being sleepy or tired during the day. ?If you are sleepy or tired during the day, you may also: ?Not be able to focus your mind (concentrate). ?Forget things. ?Get angry a lot and have mood swings. ?Feel sad (depressed). ?Have changes in your personality. ?Have less interest in sex, if you are female. ?Be unable to have an erection, if you are female. ?How is this treated? ? ?Sleeping on your side. ?Using a medicine to get rid of mucus in your nose (decongestant). ?Avoiding the use of alcohol, medicines to help you relax, or certain pain medicines (narcotics). ?Losing weight, if needed. ?Changing your diet. ?Quitting smoking. ?Using a machine to open your airway while you sleep, such as: ?An oral appliance. This is a mouthpiece that shifts your lower jaw forward. ?A CPAP device. This device blows air through a mask when you breathe out (exhale). ?An EPAP device. This has valves that you put in each nostril. ?A BIPAP device. This device blows air through a mask when you breathe in (inhale) and breathe out. ?Having surgery if other treatments do not work. ?Follow these instructions at home: ?Lifestyle ?Make changes that your doctor recommends. ?Eat a healthy diet. ?Lose weight if needed. ?Avoid alcohol, medicines to help you relax, and some pain medicines. ?Do not smoke or use any products that contain nicotine or tobacco. If you need help quitting, ask your doctor. ?General instructions ?Take over-the-counter  and prescription medicines only as told by your doctor. ?If you were given a machine to use while you sleep, use it only as told by your doctor. ?If you are having surgery, make sure to tell your doctor you have sleep apnea. You may need to bring your device with you. ?Keep all follow-up visits. ?Contact a  doctor if: ?The machine that you were given to use during sleep bothers you or does not seem to be working. ?You do not get better. ?You get worse. ?Get help right away if: ?Your chest hurts. ?You have trouble breathing in enough air. ?You have an uncomfortable feeling in your back, arms, or stomach. ?You have trouble talking. ?One side of your body feels weak. ?A part of your face is hanging down. ?These symptoms may be an emergency. Get help right away. Call your local emergency services (911 in the [place] ?Do not wait to see if the symptoms will go away. ?Do not drive yourself to the hospital. ?Summary ?This condition affects breathing during sleep. ?The most common cause is a collapsed or blocked airway. ?The goal of treatment is to help you breathe normally while you sleep. ?This information is not intended to replace advice given to you by your health care provider. Make sure you discuss any questions you have with your health care provider. ?Document Revised: 06/13/2021 Document Reviewed: 10/13/2020 ?Elsevier Patient Education ? 2022 [employer]. ? ? ?

## 2022-01-23 NOTE — Progress Notes (Signed)
? ?'@Patient'$  ID: Dan Maker, female    DOB: Jun 14, 1951, 71 y.o.   MRN: [id number] ? ?Chief Complaint  ?Patient presents with  ? sleep consult   ?  Pt states she has never had a sleep study done before. No cpap. Pt states she has afib. Pt also states she snores.   ? ? ?Referring provider: ?Janina Mayo, MD ? ?HPI: ?71 year old female, former smoker.  Past medical history significant for A-fib, allergic rhinitis, arthritis, hypercholesterolemia, irritable bowel syndrome, menopause. ? ?01/23/2022 ?Patient presents today for sleep consult. Referred by cardiology d/t afib, dx in October. She is currently on Xarelto '20mg'$  daily and metoprolol tartrate '25mg'$  daily. She endorses symptoms of loud snoring. She will fall asleep watching TV. Typical between is around 10-11pm. It takes her 5 mins to fall asleep. She wakes up on average twice at night to use restroom. She starts her day between 6-7am. Denies narcolepsy, cataplexy or sleep walking.  ? ?Sleep questionnaire ?Symptoms- Loud snoring  ?Prior sleep study- None ?Bedtime- 10-11pm  ?Time to fall asleep- 5 min ?Nocturnal awakenings- twice  ?Out of bed/start of day- 6-7am ?Weight changes- None ?Do you operate heavy machinery- No ?Do you currently wear CPAP- No ?Do you current wear oxygen- No ?Epworth- 2 ? ?Allergies  ?Allergen Reactions  ? Codeine   ?  "STOMACH CRAMPS"  ? ? ?Immunization History  ?Administered Date(s) Administered  ? PFIZER Comirnaty(Gray Top)Covid-19 Tri-Sucrose Vaccine 06/07/2021  ? ? ?Past Medical History:  ?Diagnosis Date  ? CIN I (cervical intraepithelial neoplasia I)   ? Fibroid   ? Macular degeneration, age related   ? Psoriasis   ? VAIN (vaginal intraepithelial neoplasia)   ? VAIN III (vaginal intraepithelial neoplasia grade III)   ? ? ?Tobacco History: ?Social History  ? ?Tobacco Use  ?Smoking Status Former  ?Smokeless Tobacco Never  ? ?Counseling given: Not Answered ? ? ?Outpatient Medications Prior to Visit  ?Medication Sig Dispense Refill  ?  Calcium Carbonate-Vitamin D (CALCIUM + D PO) Take by mouth.      ? Glucosamine-Chondroitin (GLUCOSAMINE CHONDR COMPLEX PO) Take by mouth.      ? metoprolol tartrate (LOPRESSOR) 25 MG tablet Take 1 tablet (25 mg total) by mouth 2 (two) times daily. 180 tablet 3  ? montelukast (SINGULAIR) 10 MG tablet Take 10 mg by mouth daily.    ? Multiple Vitamin (MULTIVITAMIN) capsule Take 1 capsule by mouth daily.      ? Multiple Vitamins-Minerals (PRESERVISION AREDS 2) CAPS See admin instructions.    ? rivaroxaban (XARELTO) 20 MG TABS tablet Take 1 tablet (20 mg total) by mouth daily. 90 tablet 3  ? ?No facility-administered medications prior to visit.  ? ? ?Review of Systems ? ?Review of Systems  ?Constitutional:  Negative for fatigue.  ?HENT: Negative.    ?Respiratory:  Negative for apnea, cough, shortness of breath and wheezing.   ?Cardiovascular: Negative.   ? ? ?Physical Exam ? ?BP 124/72 (BP Location: Right Arm, Patient Position: Sitting, Cuff Size: Normal)   Pulse 69   Temp 98.9 ?F (37.2 ?C) (Oral)   Ht '5\' 1"'$  (1.549 m)   Wt 169 lb (76.7 kg)   SpO2 97%   BMI 31.93 kg/m?  ?Physical Exam ?Constitutional:   ?   Appearance: Normal appearance.  ?HENT:  ?   Head: Normocephalic and atraumatic.  ?   Mouth/Throat:  ?   Mouth: Mucous membranes are moist.  ?   Pharynx: Oropharynx is clear.  ?  Comments: Mallampati class I ?Cardiovascular:  ?   Rate and Rhythm: Normal rate and regular rhythm.  ?   Comments: RRR ?Pulmonary:  ?   Effort: Pulmonary effort is normal.  ?   Breath sounds: Normal breath sounds.  ?Musculoskeletal:     ?   General: Normal range of motion.  ?Skin: ?   General: Skin is warm and dry.  ?Neurological:  ?   General: No focal deficit present.  ?   Mental Status: She is alert and oriented to person, place, and time. Mental status is at baseline.  ?Psychiatric:     ?   Mood and Affect: Mood normal.     ?   Behavior: Behavior normal.     ?   Thought Content: Thought content normal.     ?   Judgment: Judgment  normal.  ?  ? ?Lab Results: ? ?CBC ?   ?Component Value Date/Time  ? WBC 6.6 08/25/2021 2257  ? RBC 4.89 08/25/2021 2257  ? HGB 14.4 08/25/2021 2257  ? HCT 43.3 08/25/2021 2257  ? PLT 334 08/25/2021 2257  ? MCV 88.5 08/25/2021 2257  ? MCH 29.4 08/25/2021 2257  ? MCHC 33.3 08/25/2021 2257  ? RDW 13.0 08/25/2021 2257  ? LYMPHSABS 2.4 08/25/2021 2257  ? MONOABS 0.5 08/25/2021 2257  ? EOSABS 0.2 08/25/2021 2257  ? BASOSABS 0.1 08/25/2021 2257  ? ? ?BMET ?   ?Component Value Date/Time  ? NA 142 08/25/2021 2257  ? K 3.3 (L) 08/25/2021 2257  ? CL 104 08/25/2021 2257  ? CO2 27 08/25/2021 2257  ? GLUCOSE 117 (H) 08/25/2021 2257  ? BUN 17 08/25/2021 2257  ? CREATININE 0.90 08/25/2021 2257  ? CALCIUM 9.5 08/25/2021 2257  ? GFRNONAA >60 08/25/2021 2257  ? ? ?BNP ?No results found for: BNP ? ?ProBNP ?No results found for: PROBNP ? ?Imaging: ?No results found. ? ? ?Assessment & Plan:  ? ?Loud snoring ?-Patient has symptoms of loud snoring. Dx with afib ib October 2022, currently in sinus rhythm. Epworth 2.  Concern patient could have obstructive sleep apnea, needs home sleep study to evaluate. Discussed risk of untreated sleep apnea including cardiac arrhythmias, pulm HTN, stroke, DM. We briefly reviewed treatment options. Encouraged patient to work on weight loss efforts and focus on side sleeping position/elevate head of bed. Advised against driving if experiencing excessive daytime sleepiness. Follow-up in 4-6 weeks to review sleep study results and discuss treatment options further.  ? ?Paroxysmal A-fib ([place]) ?- Stable; Regular rate and rhythm on exam ?- Continue Xarelto '20mg'$  daily and metoprolol tartrate '25mg'$  daily ?- Following with cardiology  ? ? ?Martyn Ehrich, NP ?01/23/2022 ? ?

## 2022-02-26 DIAGNOSIS — H47092 Other disorders of optic nerve, not elsewhere classified, left eye: Secondary | ICD-10-CM | POA: Diagnosis not present

## 2022-03-07 ENCOUNTER — Encounter: Payer: Self-pay | Admitting: Internal Medicine

## 2022-03-08 ENCOUNTER — Other Ambulatory Visit: Payer: Self-pay

## 2022-03-08 DIAGNOSIS — I480 Paroxysmal atrial fibrillation: Secondary | ICD-10-CM

## 2022-03-08 MED ORDER — APIXABAN 5 MG PO TABS
5.0000 mg | ORAL_TABLET | Freq: Two times a day (BID) | ORAL | 3 refills | Status: DC
Start: 2022-03-08 — End: 2023-02-26

## 2022-03-08 NOTE — Telephone Encounter (Signed)
Spoke with patient about new prescription for eliquis 5 mg twice daily. Patient voiced understanding to start eliquis after she completes her xarelto. Samples set aside for patient. Note also added to MyChart. Order for eliquis placed. ?

## 2022-03-19 DIAGNOSIS — D120 Benign neoplasm of cecum: Secondary | ICD-10-CM | POA: Diagnosis not present

## 2022-03-19 DIAGNOSIS — Z800 Family history of malignant neoplasm of digestive organs: Secondary | ICD-10-CM | POA: Diagnosis not present

## 2022-03-19 DIAGNOSIS — D124 Benign neoplasm of descending colon: Secondary | ICD-10-CM | POA: Diagnosis not present

## 2022-03-19 DIAGNOSIS — D127 Benign neoplasm of rectosigmoid junction: Secondary | ICD-10-CM | POA: Diagnosis not present

## 2022-03-19 DIAGNOSIS — K648 Other hemorrhoids: Secondary | ICD-10-CM | POA: Diagnosis not present

## 2022-03-19 DIAGNOSIS — K5730 Diverticulosis of large intestine without perforation or abscess without bleeding: Secondary | ICD-10-CM | POA: Diagnosis not present

## 2022-03-21 ENCOUNTER — Ambulatory Visit: Payer: Medicare Other

## 2022-03-21 DIAGNOSIS — D127 Benign neoplasm of rectosigmoid junction: Secondary | ICD-10-CM | POA: Diagnosis not present

## 2022-03-21 DIAGNOSIS — D120 Benign neoplasm of cecum: Secondary | ICD-10-CM | POA: Diagnosis not present

## 2022-03-21 DIAGNOSIS — R0683 Snoring: Secondary | ICD-10-CM | POA: Diagnosis not present

## 2022-03-21 DIAGNOSIS — G4733 Obstructive sleep apnea (adult) (pediatric): Secondary | ICD-10-CM | POA: Diagnosis not present

## 2022-03-21 DIAGNOSIS — D124 Benign neoplasm of descending colon: Secondary | ICD-10-CM | POA: Diagnosis not present

## 2022-03-27 DIAGNOSIS — G4733 Obstructive sleep apnea (adult) (pediatric): Secondary | ICD-10-CM

## 2022-03-27 DIAGNOSIS — R0683 Snoring: Secondary | ICD-10-CM | POA: Diagnosis not present

## 2022-04-08 ENCOUNTER — Telehealth: Payer: Self-pay | Admitting: Primary Care

## 2022-04-08 NOTE — Telephone Encounter (Signed)
Called patient and she is wanting to know the results of her home sleep study that she had done at the beginning of the month. Advised patient that I would message Beth to get those results.   Beth please advise

## 2022-04-09 NOTE — Telephone Encounter (Signed)
Called patient and went over the results of the home sleep study with patient. She states she is going to try to loose some weight as Eustaquio Maize suggested and also try a wedge pillow. She states that she will reach back out to [place] if she wants to move forward with the oral appliance. Nothing further needed at this time

## 2022-04-09 NOTE — Telephone Encounter (Signed)
I had wanted her to schedule follow-up visit after completing sleep study to review results and treatment options if needed. Below are results, no apt needed if she has not additional concerns. Please place order for oral appliance if shed like this   HST on 03/21/22 showed no evidence of OSA, average AHI was 2.7/hr with SpO2 low 84% (baseline 94%). Occasional apneas and hypopneas within normal limits. We can refer her to orthodontics to help manage snoring, may benefit from trying oral appliance

## 2022-04-11 DIAGNOSIS — Z9189 Other specified personal risk factors, not elsewhere classified: Secondary | ICD-10-CM | POA: Diagnosis not present

## 2022-05-30 DIAGNOSIS — H47092 Other disorders of optic nerve, not elsewhere classified, left eye: Secondary | ICD-10-CM | POA: Diagnosis not present

## 2022-05-30 DIAGNOSIS — H3540 Unspecified peripheral retinal degeneration: Secondary | ICD-10-CM | POA: Diagnosis not present

## 2022-06-19 DIAGNOSIS — D224 Melanocytic nevi of scalp and neck: Secondary | ICD-10-CM | POA: Diagnosis not present

## 2022-06-19 DIAGNOSIS — D225 Melanocytic nevi of trunk: Secondary | ICD-10-CM | POA: Diagnosis not present

## 2022-06-19 DIAGNOSIS — D2372 Other benign neoplasm of skin of left lower limb, including hip: Secondary | ICD-10-CM | POA: Diagnosis not present

## 2022-06-19 DIAGNOSIS — D2371 Other benign neoplasm of skin of right lower limb, including hip: Secondary | ICD-10-CM | POA: Diagnosis not present

## 2022-06-19 DIAGNOSIS — L821 Other seborrheic keratosis: Secondary | ICD-10-CM | POA: Diagnosis not present

## 2022-06-19 DIAGNOSIS — D485 Neoplasm of uncertain behavior of skin: Secondary | ICD-10-CM | POA: Diagnosis not present

## 2022-06-19 DIAGNOSIS — D2272 Melanocytic nevi of left lower limb, including hip: Secondary | ICD-10-CM | POA: Diagnosis not present

## 2022-06-19 DIAGNOSIS — D1801 Hemangioma of skin and subcutaneous tissue: Secondary | ICD-10-CM | POA: Diagnosis not present

## 2022-06-19 DIAGNOSIS — L720 Epidermal cyst: Secondary | ICD-10-CM | POA: Diagnosis not present

## 2022-06-19 DIAGNOSIS — D2262 Melanocytic nevi of left upper limb, including shoulder: Secondary | ICD-10-CM | POA: Diagnosis not present

## 2022-06-19 DIAGNOSIS — D2261 Melanocytic nevi of right upper limb, including shoulder: Secondary | ICD-10-CM | POA: Diagnosis not present

## 2022-06-19 DIAGNOSIS — D220 Melanocytic nevi of lip: Secondary | ICD-10-CM | POA: Diagnosis not present

## 2022-07-08 DIAGNOSIS — L98499 Non-pressure chronic ulcer of skin of other sites with unspecified severity: Secondary | ICD-10-CM | POA: Diagnosis not present

## 2022-07-08 DIAGNOSIS — D485 Neoplasm of uncertain behavior of skin: Secondary | ICD-10-CM | POA: Diagnosis not present

## 2022-07-18 DIAGNOSIS — Z803 Family history of malignant neoplasm of breast: Secondary | ICD-10-CM | POA: Diagnosis not present

## 2022-07-18 DIAGNOSIS — N644 Mastodynia: Secondary | ICD-10-CM | POA: Diagnosis not present

## 2022-07-24 DIAGNOSIS — R928 Other abnormal and inconclusive findings on diagnostic imaging of breast: Secondary | ICD-10-CM | POA: Diagnosis not present

## 2022-07-24 DIAGNOSIS — N644 Mastodynia: Secondary | ICD-10-CM | POA: Diagnosis not present

## 2022-08-15 ENCOUNTER — Encounter: Payer: Self-pay | Admitting: Internal Medicine

## 2022-08-15 ENCOUNTER — Ambulatory Visit: Payer: Medicare Other | Attending: Internal Medicine | Admitting: Internal Medicine

## 2022-08-15 VITALS — BP 118/70 | HR 63 | Ht 61.0 in | Wt 172.0 lb

## 2022-08-15 DIAGNOSIS — I4891 Unspecified atrial fibrillation: Secondary | ICD-10-CM | POA: Diagnosis not present

## 2022-08-15 NOTE — Progress Notes (Signed)
Cardiology Office Note:    Date:  08/15/2022   ID:  ICESIS RENN, DOB 09-Nov-1951, MRN [id number]  PCP:  Aurea Graff.Marlou Sa, MD   [facility] Cardiologist:  Janina Mayo, MD     Referring MD: Aurea Graff.Marlou Sa, MD   No chief complaint on file. Paroxysmal Atrial fibrillation  History of Present Illness:   Initial HPI  Lori Gordon is a 71 y.o. female with a hx of atrial fibrillation on xarelto, CHADS2VASC 2  Lori Gordon presented with the ED with afib with RVR 08/25/2021 managed with diltiazem gtt, IV metop. Her heart rate was up to 170s.  She presented at [facility], rates improved ; she reports still in atrial fibrillation, no 12 lead.  Her trop was negative 8 and then 56. She was discharged on metop 25 mg BID, xarelto 20 mg daily. Her PCP checked her thyroid levels and were normal. Crt 0.9. ON 10/10 after being in the ER, her palpitations stopped. She documented her rates and rhythm from her smart watch and she had sinus rhythm rates high 50s-70s.  Her palpitations started at 10 PM at night. She was watching television. She felt pain in her jaw. She has no CP or SOB. She had some fatigue afterwards. She had some headaches. They have eased up. She feels better. Her blood pressure is normal.  No LE edema. No orthopnea. Father had BAV and valve replacement with bioprosthesis at 58. Her brothers had BAV and had a mechanical valve replacement. Her brother has a pacemaker at 80 years of age. Her children are healthy. Smoked in early 80s, then stopped. Social etoh.  She does yoga and is planning to travel to [place].  TSH 2.6  Interim Hx: She has a few questions about the source of afib. She notes some brief SOB in the morning. She's in normal rhythm today. She feels palpitations at times. Otherwise she is asymptomatic.  Interim Hx 08/15/2022 She switched to eliquis. No significant palpitations/SOB. She underwent sleep study , negative. Blood pressure is well controlled. Her watch tracks  her HR 60-110.  Cardiology Studies TTE 10/09/2021- normal EF/RV. Normal strain.No pulmonary HTN. No valve disease. Normal LA size.    Past Medical History:  Diagnosis Date   CIN I (cervical intraepithelial neoplasia I)    Fibroid    Macular degeneration, age related    Psoriasis    VAIN (vaginal intraepithelial neoplasia)    VAIN III (vaginal intraepithelial neoplasia grade III)     Past Surgical History:  Procedure Laterality Date   ABDOMINAL HYSTERECTOMY  1999   TAH   BROKEN LEG  2007   C02 LASER OF VAGINA AND VAGINAL BIOPSY  2007   EXCISION OF CYST FROM CHIN     TUBAL LIGATION  1985    Current Medications: No outpatient medications have been marked as taking for the 08/15/22 encounter (Appointment) with Janina Mayo, MD.     Allergies:   Codeine   Social History   Socioeconomic History   Marital status: Married    Spouse name: Not on file   Number of children: Not on file   Years of education: Not on file   Highest education level: Not on file  Occupational History   Not on file  Tobacco Use   Smoking status: Former   Smokeless tobacco: Never  Substance and Sexual Activity   Alcohol use: Yes    Alcohol/week: 2.0 standard drinks of alcohol    Types: 2 Standard drinks or  equivalent per week   Drug use: No   Sexual activity: Yes    Birth control/protection: Surgical  Other Topics Concern   Not on file  Social History Narrative   Not on file   Social Determinants of Health   Financial Resource Strain: Not on file  Food Insecurity: Not on file  Transportation Needs: Not on file  Physical Activity: Not on file  Stress: Not on file  Social Connections: Not on file     Family History: The patient's family history includes Breast cancer in her cousin, maternal aunt, and mother; Cancer in her cousin; Diabetes in her mother; Heart disease in her father and paternal grandmother; Hypertension in her mother.  ROS:   Please see the history of present  illness.     All other systems reviewed and are negative.  EKGs/Labs/Other Studies Reviewed:    The following studies were reviewed today:   EKG:  EKG is  ordered today.  The ekg ordered today demonstrates   Prior EKGs NSR, no ischemic changes. Qtc 406 ms NSR   Recent Labs: 08/25/2021: BUN 17; Creatinine, Ser 0.90; Hemoglobin 14.4; Platelets 334; Potassium 3.3; Sodium 142  Recent Lipid Panel No results found for: "CHOL", "TRIG", "HDL", "CHOLHDL", "VLDL", "LDLCALC", "LDLDIRECT"   Risk Assessment/Calculations:    CHA2DS2-VASc Score = 2   This indicates a 2.2% annual risk of stroke. The patient's score is based upon: CHF History: 0 HTN History: 0 Diabetes History: 0 Stroke History: 0 Vascular Disease History: 0 Age Score: 1 Gender Score: 1          Physical Exam:    VS:   Vitals:   08/15/22 0758  BP: 118/70  Pulse: 63      Wt Readings from Last 3 Encounters:  01/23/22 169 lb (76.7 kg)  01/01/22 170 lb 9.6 oz (77.4 kg)  09/21/21 168 lb 6.4 oz (76.4 kg)     GEN:  Well nourished, well developed in no acute distress HEENT: Normal NECK: No JVD CARDIAC: RRR, no murmurs, rubs, gallops Vasc: 2+ radial BL RESPIRATORY:  Clear to auscultation without rales, wheezing or rhonchi  ABDOMEN: Soft, non-tender, non-distended MUSCULOSKELETAL:  No edema; No deformity  SKIN: Warm and dry NEUROLOGIC:  Alert and oriented x 3 PSYCHIATRIC:  Normal affect   ASSESSMENT:    #Paroxysmal Atrial Fibrillation: In sinus rhythm. Chads2VASC 2 on xarelto. She mentioned wanting to do yoga , exercise, and traveling, which I said were all fine in the past. She also noted snoring, will plan for sleep study. Will continue BB for rate control and AC. Sleep study was negative. She is doing well. She is tolerating the medications well. In terms of AAD, can likely be a candidate for AAD/PVI. We discussed that if she has significant palpitations to check her heart rates, if >120 for a couple of  hours she can go to the ED. - continue eliquis 5 mg BID - cont BB   Plan:  Follow up in 12 months      Medication Adjustments/Labs and Tests Ordered: Current medicines are reviewed at length with the patient today.  Concerns regarding medicines are outlined above.   Signed, Janina Mayo, MD  08/15/2022 7:48 AM    [facility]

## 2022-08-15 NOTE — Patient Instructions (Signed)
Medication Instructions:   No changes  *If you need a refill on your cardiac medications before your next appointment, please call your pharmacy*   Lab Work  Not needed   Testing/Procedures: Not needed   Follow-Up: At [facility], you and your health needs are our priority.  As part of our continuing mission to provide you with exceptional heart care, we have created designated Provider Care Teams.  These Care Teams include your primary Cardiologist (physician) and Advanced Practice Providers (APPs -  Physician Assistants and Nurse Practitioners) who all work together to provide you with the care you need, when you need it.     Your next appointment:   12 month(s)  The format for your next appointment:   In Person  Provider:   Janina Mayo, MD    Other Instructions

## 2022-09-16 ENCOUNTER — Encounter: Payer: Self-pay | Admitting: Internal Medicine

## 2022-09-16 MED ORDER — METOPROLOL SUCCINATE ER 50 MG PO TB24: 50.0000 mg | ORAL_TABLET | Freq: Every day | ORAL | 3 refills | Status: DC

## 2022-09-21 ENCOUNTER — Other Ambulatory Visit: Payer: Self-pay | Admitting: Internal Medicine

## 2022-12-03 DIAGNOSIS — H2513 Age-related nuclear cataract, bilateral: Secondary | ICD-10-CM | POA: Diagnosis not present

## 2022-12-03 DIAGNOSIS — H3540 Unspecified peripheral retinal degeneration: Secondary | ICD-10-CM | POA: Diagnosis not present

## 2022-12-03 DIAGNOSIS — H43392 Other vitreous opacities, left eye: Secondary | ICD-10-CM | POA: Diagnosis not present

## 2022-12-03 DIAGNOSIS — H47092 Other disorders of optic nerve, not elsewhere classified, left eye: Secondary | ICD-10-CM | POA: Diagnosis not present

## 2023-01-07 DIAGNOSIS — D179 Benign lipomatous neoplasm, unspecified: Secondary | ICD-10-CM | POA: Diagnosis not present

## 2023-01-07 DIAGNOSIS — J309 Allergic rhinitis, unspecified: Secondary | ICD-10-CM | POA: Diagnosis not present

## 2023-01-07 DIAGNOSIS — Z0000 Encounter for general adult medical examination without abnormal findings: Secondary | ICD-10-CM | POA: Diagnosis not present

## 2023-01-07 DIAGNOSIS — K5730 Diverticulosis of large intestine without perforation or abscess without bleeding: Secondary | ICD-10-CM | POA: Diagnosis not present

## 2023-01-07 DIAGNOSIS — I480 Paroxysmal atrial fibrillation: Secondary | ICD-10-CM | POA: Diagnosis not present

## 2023-01-07 DIAGNOSIS — Z9181 History of falling: Secondary | ICD-10-CM | POA: Diagnosis not present

## 2023-01-07 DIAGNOSIS — E7800 Pure hypercholesterolemia, unspecified: Secondary | ICD-10-CM | POA: Diagnosis not present

## 2023-02-11 DIAGNOSIS — E7800 Pure hypercholesterolemia, unspecified: Secondary | ICD-10-CM | POA: Diagnosis not present

## 2023-02-26 ENCOUNTER — Other Ambulatory Visit: Payer: Self-pay | Admitting: Internal Medicine

## 2023-02-26 DIAGNOSIS — I480 Paroxysmal atrial fibrillation: Secondary | ICD-10-CM

## 2023-02-26 NOTE — Telephone Encounter (Signed)
Prescription refill request for Eliquis received. Indication: Afib  Last office visit: 08/15/22([facility])  Scr: 0.83 (02/11/23 via [facility])  Age: 71 Weight: 78kg  Appropriate dose. Refill sent.

## 2023-06-23 ENCOUNTER — Other Ambulatory Visit: Payer: Self-pay | Admitting: Internal Medicine

## 2023-06-26 ENCOUNTER — Other Ambulatory Visit: Payer: Self-pay | Admitting: Internal Medicine

## 2023-07-08 DIAGNOSIS — I480 Paroxysmal atrial fibrillation: Secondary | ICD-10-CM | POA: Diagnosis not present

## 2023-07-08 DIAGNOSIS — E7800 Pure hypercholesterolemia, unspecified: Secondary | ICD-10-CM | POA: Diagnosis not present

## 2023-07-08 DIAGNOSIS — K589 Irritable bowel syndrome without diarrhea: Secondary | ICD-10-CM | POA: Diagnosis not present

## 2023-07-08 DIAGNOSIS — D179 Benign lipomatous neoplasm, unspecified: Secondary | ICD-10-CM | POA: Diagnosis not present

## 2023-07-28 DIAGNOSIS — Z1231 Encounter for screening mammogram for malignant neoplasm of breast: Secondary | ICD-10-CM | POA: Diagnosis not present

## 2023-08-20 ENCOUNTER — Ambulatory Visit: Payer: Medicare Other | Attending: Internal Medicine | Admitting: Internal Medicine

## 2023-08-20 ENCOUNTER — Encounter: Payer: Self-pay | Admitting: Internal Medicine

## 2023-08-20 VITALS — BP 120/82 | HR 66 | Ht 61.0 in | Wt 179.8 lb

## 2023-08-20 DIAGNOSIS — I4891 Unspecified atrial fibrillation: Secondary | ICD-10-CM | POA: Diagnosis not present

## 2023-08-20 NOTE — Progress Notes (Signed)
Cardiology Office Note:    Date:  08/20/2023   ID:  Lori Gordon, DOB [birth year], MRN [id number]  PCP:  Lori Gowda.Lori Saucer, MD   [facility] Providers Cardiologist:  Lori Fus, MD     Referring MD: Lori Gowda.Lori Saucer, MD   No chief complaint on file. Paroxysmal Atrial fibrillation  History of Present Illness:   Initial HPI  Lori Gordon is a 72 y.o. female with a hx of atrial fibrillation on xarelto, CHADS2VASC 2  Lori Gordon presented with the ED with afib with RVR 08/25/2021 managed with diltiazem gtt, IV metop. Her heart rate was up to 170s.  She presented [place] [facility], rates improved ; she reports still in atrial fibrillation, no 12 lead.  Her trop was negative 8 and then 56. She was discharged on metop 25 mg BID, xarelto 20 mg daily. Her PCP checked her thyroid levels and were normal. Crt 0.9. ON 10/10 after being in the ER, her palpitations stopped. She documented her rates and rhythm from her smart watch and she had sinus rhythm rates high 50s-70s.  Her palpitations started at 10 PM at night. She was watching television. She felt pain in her jaw. She has no CP or SOB. She had some fatigue afterwards. She had some headaches. They have eased up. She feels better. Her blood pressure is normal.  No LE edema. No orthopnea. Father had BAV and valve replacement with bioprosthesis at 72. Her brothers had BAV and had a mechanical valve replacement. Her brother has a pacemaker at 32 years of age. Her children are healthy. Smoked in early 80s, then stopped. Social etoh.  She does yoga and is planning to travel to [place].  TSH 2.6  Interim Hx: She has a few questions about the source of afib. She notes some brief SOB in the morning. She's in normal rhythm today. She feels palpitations at times. Otherwise she is asymptomatic.  Interim Hx 08/15/2022 She switched to eliquis. No significant palpitations/SOB. She underwent sleep study , negative. Blood pressure is well controlled. Her watch tracks  her HR 60-110.  Interim hx 08/20/2023 No palpitations. She has done well, no recurrence of Afib. Feels increased fatigue at times. Otherwise is planning a trip to [place].    Past Medical History:  Diagnosis Date   CIN I (cervical intraepithelial neoplasia I)    Fibroid    Macular degeneration, age related    Psoriasis    VAIN (vaginal intraepithelial neoplasia)    VAIN III (vaginal intraepithelial neoplasia grade III)     Past Surgical History:  Procedure Laterality Date   ABDOMINAL HYSTERECTOMY  1999   TAH   BROKEN LEG  2007   C02 LASER OF VAGINA AND VAGINAL BIOPSY  2007   EXCISION OF CYST FROM CHIN     TUBAL LIGATION  1985    Current Medications: Current Meds  Medication Sig   Calcium Carbonate-Vitamin D (CALCIUM + D PO) Take by mouth.     ELIQUIS 5 MG TABS tablet TAKE 1 TABLET(5 MG) BY MOUTH TWICE DAILY   Glucosamine-Chondroitin (GLUCOSAMINE CHONDR COMPLEX PO) Take by mouth.     metoprolol succinate (TOPROL-XL) 50 MG 24 hr tablet TAKE 1 TABLET(50 MG) BY MOUTH DAILY WITH OR IMMEDIATELY FOLLOWING A MEAL   Multiple Vitamin (MULTIVITAMIN) capsule Take 1 capsule by mouth daily.     Multiple Vitamins-Minerals (PRESERVISION AREDS 2) CAPS See admin instructions.   rosuvastatin (CRESTOR) 10 MG tablet Take 10 mg by mouth daily.  Allergies:   Codeine   Social History   Socioeconomic History   Marital status: Married    Spouse name: Not on file   Number of children: Not on file   Years of education: Not on file   Highest education level: Not on file  Occupational History   Not on file  Tobacco Use   Smoking status: Former   Smokeless tobacco: Never  Substance and Sexual Activity   Alcohol use: Yes    Alcohol/week: 2.0 standard drinks of alcohol    Types: 2 Standard drinks or equivalent per week   Drug use: No   Sexual activity: Yes    Birth control/protection: Surgical  Other Topics Concern   Not on file  Social History Narrative   Not on file   Social  Determinants of Health   Financial Resource Strain: Not on file  Food Insecurity: Not on file  Transportation Needs: Not on file  Physical Activity: Not on file  Stress: Not on file  Social Connections: Not on file     Family History: The patient's family history includes Breast cancer in her cousin, maternal aunt, and mother; Cancer in her cousin; Diabetes in her mother; Heart disease in her father and paternal grandmother; Hypertension in her mother.  ROS:   Please see the history of present illness.     All other systems reviewed and are negative.  EKGs/Labs/Other Studies Reviewed:    The following studies were reviewed today:   EKG:  EKG is  ordered today.  The ekg ordered today demonstrates   Prior EKGs NSR, no ischemic changes. Qtc 406 ms NSR   TTE 10/09/2021- normal EF/RV. Normal strain.No pulmonary HTN. No valve disease. Normal LA size.  Recent Labs: No results found for requested labs within last 365 days.  Recent Lipid Panel No results found for: "CHOL", "TRIG", "HDL", "CHOLHDL", "VLDL", "LDLCALC", "LDLDIRECT"   Risk Assessment/Calculations:     CHA2DS2-VASc Score = 2   This indicates a 2.2% annual risk of stroke. The patient's score is based upon: CHF History: 0 HTN History: 0 Diabetes History: 0 Stroke History: 0 Vascular Disease History: 0 Age Score: 1 Gender Score: 1       LDL 61 mg /dL HDL 64 mg/dL TC 540 mg/dL    Physical Exam:    VS:   Vitals:   08/20/23 0920  BP: 120/82  Pulse: 66  SpO2: 97%      Wt Readings from Last 3 Encounters:  08/20/23 179 lb 12.8 oz (81.6 kg)  08/15/22 172 lb (78 kg)  01/23/22 169 lb (76.7 kg)     GEN:  Well nourished, well developed in no acute distress HEENT: Normal NECK: No JVD CARDIAC: RRR, no murmurs, rubs, gallops Vasc: 2+ radial BL RESPIRATORY:  Clear to auscultation without rales, wheezing or rhonchi  ABDOMEN: Soft, non-tender, non-distended MUSCULOSKELETAL:  No edema; No deformity   SKIN: Warm and dry NEUROLOGIC:  Alert and oriented x 3 PSYCHIATRIC:  Normal affect   ASSESSMENT:    #Paroxysmal Atrial Fibrillation: In sinus rhythm. Chads2VASC 2 was on xarelto. She mentioned wanting to do yoga , exercise, and traveling, which I said were all fine in the past. She also noted snoring, will plan for sleep study. Will continue BB for rate control.  Sleep study was negative. She is doing well. She is tolerating the medications well. In terms of AAD, can likely be a candidate for AAD/PVI. We discussed that if she has significant palpitations to check  her heart rates, if >120 for a couple of hours she can go to the ED. - in sinus rhythm today - has not had recurrent afib, chads2vasc=2. Risk of stroke is low; we discussed she can stop eliquis, unless she requires a DCCV - will change BB to PRN   Plan:  Follow up in 6 months      Medication Adjustments/Labs and Tests Ordered: Current medicines are reviewed at length with the patient today.  Concerns regarding medicines are outlined above.   Signed, Lori Fus, MD  08/20/2023 9:38 AM    [facility]

## 2023-08-20 NOTE — Patient Instructions (Addendum)
Medication Instructions:  - May take 1/2 dose (25mg ) of Metoprolol Succinate for 1-3 days and then take as needed.    *If you need a refill on your cardiac medications before your next appointment, please call your pharmacy*     Follow-Up: At [facility], you and your health needs are our priority.  As part of our continuing mission to provide you with exceptional heart care, we have created designated Provider Care Teams.  These Care Teams include your primary Cardiologist (physician) and Advanced Practice Providers (APPs -  Physician Assistants and Nurse Practitioners) who all work together to provide you with the care you need, when you need it.  We recommend signing up for the patient portal called "MyChart".  Sign up information is provided on this After Visit Summary.  MyChart is used to connect with patients for Virtual Visits (Telemedicine).  Patients are able to view lab/test results, encounter notes, upcoming appointments, etc.  Non-urgent messages can be sent to your provider as well.   To learn more about what you can do with MyChart, go to [email or web address].    Your next appointment:   6 month(s)  The format for your next appointment:   In Person  Provider:   Maisie Fus, MD    Other Instructions Discussed stopping Eliquis as risk for stroke is low. Patient will review information provided and follow up with [place] if she wants to make changes.

## 2023-09-15 DIAGNOSIS — L821 Other seborrheic keratosis: Secondary | ICD-10-CM | POA: Diagnosis not present

## 2023-09-15 DIAGNOSIS — L738 Other specified follicular disorders: Secondary | ICD-10-CM | POA: Diagnosis not present

## 2023-09-15 DIAGNOSIS — D2372 Other benign neoplasm of skin of left lower limb, including hip: Secondary | ICD-10-CM | POA: Diagnosis not present

## 2023-09-15 DIAGNOSIS — D2262 Melanocytic nevi of left upper limb, including shoulder: Secondary | ICD-10-CM | POA: Diagnosis not present

## 2023-09-15 DIAGNOSIS — D2371 Other benign neoplasm of skin of right lower limb, including hip: Secondary | ICD-10-CM | POA: Diagnosis not present

## 2023-09-15 DIAGNOSIS — L918 Other hypertrophic disorders of the skin: Secondary | ICD-10-CM | POA: Diagnosis not present

## 2023-09-15 DIAGNOSIS — D2261 Melanocytic nevi of right upper limb, including shoulder: Secondary | ICD-10-CM | POA: Diagnosis not present

## 2023-09-15 DIAGNOSIS — D225 Melanocytic nevi of trunk: Secondary | ICD-10-CM | POA: Diagnosis not present

## 2023-09-15 DIAGNOSIS — D1801 Hemangioma of skin and subcutaneous tissue: Secondary | ICD-10-CM | POA: Diagnosis not present

## 2023-09-15 DIAGNOSIS — L218 Other seborrheic dermatitis: Secondary | ICD-10-CM | POA: Diagnosis not present

## 2023-09-19 ENCOUNTER — Encounter: Payer: Self-pay | Admitting: Internal Medicine

## 2023-09-19 MED ORDER — METOPROLOL SUCCINATE ER 50 MG PO TB24: 50.0000 mg | ORAL_TABLET | ORAL | Status: DC | PRN

## 2023-12-16 DIAGNOSIS — L738 Other specified follicular disorders: Secondary | ICD-10-CM | POA: Diagnosis not present

## 2023-12-16 DIAGNOSIS — L821 Other seborrheic keratosis: Secondary | ICD-10-CM | POA: Diagnosis not present

## 2024-01-19 ENCOUNTER — Encounter: Payer: Self-pay | Admitting: Internal Medicine

## 2024-01-19 ENCOUNTER — Ambulatory Visit: Payer: Medicare Other | Attending: Internal Medicine | Admitting: Internal Medicine

## 2024-01-19 VITALS — BP 131/71 | HR 79 | Ht 61.0 in | Wt 169.4 lb

## 2024-01-19 DIAGNOSIS — I4891 Unspecified atrial fibrillation: Secondary | ICD-10-CM | POA: Diagnosis not present

## 2024-01-19 NOTE — Patient Instructions (Signed)
 Medication Instructions:  Your physician recommends that you continue on your current medications as directed. Please refer to the Current Medication list given to you today.  *If you need a refill on your cardiac medications before your next appointment, please call your pharmacy*  Follow-Up: At [facility], you and your health needs are our priority.  As part of our continuing mission to provide you with exceptional heart care, we have created designated Provider Care Teams.  These Care Teams include your primary Cardiologist (physician) and Advanced Practice Providers (APPs -  Physician Assistants and Nurse Practitioners) who all work together to provide you with the care you need, when you need it.   Your next appointment:   1 year(s)  Provider:   Edd Fabian, FNP, Micah Flesher, PA-C, Marjie Skiff, PA-C, Robet Leu, PA-C, Juanda Crumble, PA-C, Joni Reining, DNP, ANP, Azalee Course, PA-C, Bernadene Person, NP, or Reather Littler, NP

## 2024-01-19 NOTE — Progress Notes (Signed)
 Cardiology Office Note:    Date:  01/19/2024   ID:  Lori Gordon, DOB Apr 16, 1951, MRN [id number]  PCP:  Asencion Gowda.August Saucer, MD (Inactive)   [facility] Providers Cardiologist:  Maisie Fus, MD     Referring MD: Asencion Gowda.August Saucer, MD   No chief complaint on file. Paroxysmal Atrial fibrillation  History of Present Illness:   Initial HPI  Lori Gordon is a 73 y.o. female with a hx of atrial fibrillation on xarelto, CHADS2VASC 2  Lori Gordon presented with the ED with afib with RVR 08/25/2021 managed with diltiazem gtt, IV metop. Her heart rate was up to 170s.  She presented at [facility], rates improved ; she reports still in atrial fibrillation, no 12 lead.  Her trop was negative 8 and then 56. She was discharged on metop 25 mg BID, xarelto 20 mg daily. Her PCP checked her thyroid levels and were normal. Crt 0.9. ON 10/10 after being in the ER, her palpitations stopped. She documented her rates and rhythm from her smart watch and she had sinus rhythm rates high 50s-70s.  Her palpitations started at 10 PM at night. She was watching television. She felt pain in her jaw. She has no CP or SOB. She had some fatigue afterwards. She had some headaches. They have eased up. She feels better. Her blood pressure is normal.  No LE edema. No orthopnea. Father had BAV and valve replacement with bioprosthesis at 72. Her brothers had BAV and had a mechanical valve replacement. Her brother has a pacemaker at 28 years of age. Her children are healthy. Smoked in early 80s, then stopped. Social etoh.  She does yoga and is planning to travel to [place].  TSH 2.6  Interim Hx: She has a few questions about the source of afib. She notes some brief SOB in the morning. She's in normal rhythm today. She feels palpitations at times. Otherwise she is asymptomatic.  Interim Hx 08/15/2022 She switched to eliquis. No significant palpitations/SOB. She underwent sleep study , negative. Blood pressure is well controlled. Her  watch tracks her HR 60-110.  Interim hx 08/20/2023 No palpitations. She has done well, no recurrence of Afib. Feels increased fatigue at times. Otherwise is planning a trip to [place].   Interim hx 01/19/2024 She denies palpitations. She uses her apple watch. She stopped BB due to fatigue. She feels better. Her mom turned 94. She is going [place]    Past Medical History:  Diagnosis Date   CIN I (cervical intraepithelial neoplasia I)    Fibroid    Macular degeneration, age related    Psoriasis    VAIN (vaginal intraepithelial neoplasia)    VAIN III (vaginal intraepithelial neoplasia grade III)     Past Surgical History:  Procedure Laterality Date   ABDOMINAL HYSTERECTOMY  1999   TAH   BROKEN LEG  2007   C02 LASER OF VAGINA AND VAGINAL BIOPSY  2007   EXCISION OF CYST FROM CHIN     TUBAL LIGATION  1985    Current Medications: No outpatient medications have been marked as taking for the 01/19/24 encounter (Appointment) with Maisie Fus, MD.     Allergies:   Codeine   Social History   Socioeconomic History   Marital status: Married    Spouse name: Not on file   Number of children: Not on file   Years of education: Not on file   Highest education level: Not on file  Occupational History   Not on  file  Tobacco Use   Smoking status: Former   Smokeless tobacco: Never  Substance and Sexual Activity   Alcohol use: Yes    Alcohol/week: 2.0 standard drinks of alcohol    Types: 2 Standard drinks or equivalent per week   Drug use: No   Sexual activity: Yes    Birth control/protection: Surgical  Other Topics Concern   Not on file  Social History Narrative   Not on file   Social Drivers of Health   Financial Resource Strain: Not on file  Food Insecurity: Not on file  Transportation Needs: Not on file  Physical Activity: Not on file  Stress: Not on file  Social Connections: Not on file     Family History: The patient's family history includes Breast cancer in her  cousin, maternal aunt, and mother; Cancer in her cousin; Diabetes in her mother; Heart disease in her father and paternal grandmother; Hypertension in her mother.  ROS:   Please see the history of present illness.     All other systems reviewed and are negative.  EKGs/Labs/Other Studies Reviewed:    The following studies were reviewed today:   EKG:  EKG is  ordered today.  The ekg ordered today demonstrates   Prior EKGs NSR, no ischemic changes. Qtc 406 ms NSR   TTE 10/09/2021- normal EF/RV. Normal strain.No pulmonary HTN. No valve disease. Normal LA size.  Recent Labs: No results found for requested labs within last 365 days.  Recent Lipid Panel No results found for: "CHOL", "TRIG", "HDL", "CHOLHDL", "VLDL", "LDLCALC", "LDLDIRECT"   Risk Assessment/Calculations:     CHA2DS2-VASc Score = 2   This indicates a 2.2% annual risk of stroke. The patient's score is based upon: CHF History: 0 HTN History: 0 Diabetes History: 0 Stroke History: 0 Vascular Disease History: 0 Age Score: 1 Gender Score: 1       LDL 61 mg /dL HDL 64 mg/dL TC 161 mg/dL    Physical Exam:    VS:  Vitals:   01/19/24 1453  BP: 131/71  Pulse: 79  SpO2: 95%      Wt Readings from Last 3 Encounters:  08/20/23 179 lb 12.8 oz (81.6 kg)  08/15/22 172 lb (78 kg)  01/23/22 169 lb (76.7 kg)     GEN:  Well nourished, well developed in no acute distress HEENT: Normal NECK: No JVD CARDIAC: RRR, no murmurs, rubs, gallops Vasc: 2+ radial BL RESPIRATORY:  Clear to auscultation without rales, wheezing or rhonchi  ABDOMEN: Soft, non-tender, non-distended MUSCULOSKELETAL:  No edema; No deformity  SKIN: Warm and dry NEUROLOGIC:  Alert and oriented x 3 PSYCHIATRIC:  Normal affect   ASSESSMENT:    #Paroxysmal Atrial Fibrillation: In sinus rhythm. Chads2VASC 2 was on xarelto, but stopped with no strong indication. Eliquis was prescribed as well as BB. She had fatigue with a BB so this was  stopped. -Sleep study was negative.  -She is in sinus rhythm and has not had recurrence of atrial fibrillation.  Can continue off of eliquis and beta-blocker    Plan:  Follow up in 12 months with an APP      Medication Adjustments/Labs and Tests Ordered: Current medicines are reviewed at length with the patient today.  Concerns regarding medicines are outlined above.   Signed, Maisie Fus, MD  01/19/2024 2:48 PM    [facility]

## 2024-01-29 DIAGNOSIS — H353131 Nonexudative age-related macular degeneration, bilateral, early dry stage: Secondary | ICD-10-CM | POA: Diagnosis not present

## 2024-01-29 DIAGNOSIS — H2513 Age-related nuclear cataract, bilateral: Secondary | ICD-10-CM | POA: Diagnosis not present

## 2024-01-29 DIAGNOSIS — H47092 Other disorders of optic nerve, not elsewhere classified, left eye: Secondary | ICD-10-CM | POA: Diagnosis not present

## 2024-01-29 DIAGNOSIS — H524 Presbyopia: Secondary | ICD-10-CM | POA: Diagnosis not present

## 2024-02-06 DIAGNOSIS — I480 Paroxysmal atrial fibrillation: Secondary | ICD-10-CM | POA: Diagnosis not present

## 2024-02-06 DIAGNOSIS — E7800 Pure hypercholesterolemia, unspecified: Secondary | ICD-10-CM | POA: Diagnosis not present

## 2024-02-10 DIAGNOSIS — Z0000 Encounter for general adult medical examination without abnormal findings: Secondary | ICD-10-CM | POA: Diagnosis not present

## 2024-02-10 DIAGNOSIS — M1990 Unspecified osteoarthritis, unspecified site: Secondary | ICD-10-CM | POA: Diagnosis not present

## 2024-02-10 DIAGNOSIS — J309 Allergic rhinitis, unspecified: Secondary | ICD-10-CM | POA: Diagnosis not present

## 2024-02-10 DIAGNOSIS — E7800 Pure hypercholesterolemia, unspecified: Secondary | ICD-10-CM | POA: Diagnosis not present

## 2024-02-10 DIAGNOSIS — I480 Paroxysmal atrial fibrillation: Secondary | ICD-10-CM | POA: Diagnosis not present

## 2024-02-10 DIAGNOSIS — K589 Irritable bowel syndrome without diarrhea: Secondary | ICD-10-CM | POA: Diagnosis not present

## 2024-08-02 DIAGNOSIS — Z1231 Encounter for screening mammogram for malignant neoplasm of breast: Secondary | ICD-10-CM | POA: Diagnosis not present

## 2025-01-20 ENCOUNTER — Ambulatory Visit: Admitting: Student in an Organized Health Care Education/Training Program
